# Patient Record
Sex: Male | Born: 1950 | Race: White | Hispanic: No | Marital: Married | Smoking: Never smoker
Health system: Southern US, Community
[De-identification: ages and names within clinical notes are randomized; demographics above are authoritative.]

## PROBLEM LIST (undated history)

## (undated) DIAGNOSIS — G43909 Migraine, unspecified, not intractable, without status migrainosus: Secondary | ICD-10-CM

## (undated) DIAGNOSIS — G4730 Sleep apnea, unspecified: Secondary | ICD-10-CM

## (undated) DIAGNOSIS — H269 Unspecified cataract: Secondary | ICD-10-CM

## (undated) DIAGNOSIS — T7840XA Allergy, unspecified, initial encounter: Secondary | ICD-10-CM

## (undated) DIAGNOSIS — J309 Allergic rhinitis, unspecified: Secondary | ICD-10-CM

## (undated) DIAGNOSIS — E785 Hyperlipidemia, unspecified: Secondary | ICD-10-CM

## (undated) DIAGNOSIS — K219 Gastro-esophageal reflux disease without esophagitis: Secondary | ICD-10-CM

## (undated) DIAGNOSIS — I456 Pre-excitation syndrome: Secondary | ICD-10-CM

## (undated) DIAGNOSIS — M47812 Spondylosis without myelopathy or radiculopathy, cervical region: Secondary | ICD-10-CM

## (undated) HISTORY — DX: Gastro-esophageal reflux disease without esophagitis: K21.9

## (undated) HISTORY — DX: Allergy, unspecified, initial encounter: T78.40XA

## (undated) HISTORY — PX: OTHER SURGICAL HISTORY: SHX169

## (undated) HISTORY — DX: Unspecified cataract: H26.9

## (undated) HISTORY — DX: Hyperlipidemia, unspecified: E78.5

## (undated) HISTORY — DX: Sleep apnea, unspecified: G47.30

## (undated) HISTORY — DX: Spondylosis without myelopathy or radiculopathy, cervical region: M47.812

## (undated) HISTORY — DX: Allergic rhinitis, unspecified: J30.9

## (undated) HISTORY — PX: COLONOSCOPY: SHX174

## (undated) HISTORY — DX: Pre-excitation syndrome: I45.6

## (undated) HISTORY — DX: Migraine, unspecified, not intractable, without status migrainosus: G43.909

---

## 1999-03-02 ENCOUNTER — Encounter (INDEPENDENT_AMBULATORY_CARE_PROVIDER_SITE_OTHER): Payer: Self-pay | Admitting: *Deleted

## 1999-03-02 ENCOUNTER — Ambulatory Visit (HOSPITAL_COMMUNITY): Admission: RE | Admit: 1999-03-02 | Discharge: 1999-03-02 | Payer: Self-pay | Admitting: *Deleted

## 2005-01-14 ENCOUNTER — Ambulatory Visit: Payer: Self-pay | Admitting: Internal Medicine

## 2005-01-14 LAB — CONVERTED CEMR LAB: PSA: 0.79 ng/mL

## 2005-01-18 ENCOUNTER — Ambulatory Visit: Payer: Self-pay | Admitting: Internal Medicine

## 2005-02-11 ENCOUNTER — Ambulatory Visit: Payer: Self-pay | Admitting: *Deleted

## 2005-06-27 ENCOUNTER — Ambulatory Visit: Payer: Self-pay | Admitting: Gastroenterology

## 2005-07-12 ENCOUNTER — Encounter (INDEPENDENT_AMBULATORY_CARE_PROVIDER_SITE_OTHER): Payer: Self-pay | Admitting: *Deleted

## 2005-07-12 ENCOUNTER — Ambulatory Visit: Payer: Self-pay | Admitting: Gastroenterology

## 2006-12-05 ENCOUNTER — Ambulatory Visit: Payer: Self-pay | Admitting: Cardiology

## 2007-01-06 ENCOUNTER — Ambulatory Visit: Payer: Self-pay | Admitting: Internal Medicine

## 2007-01-06 LAB — CONVERTED CEMR LAB
ALT: 22 units/L (ref 0–53)
AST: 22 units/L (ref 0–37)
Albumin: 4.1 g/dL (ref 3.5–5.2)
Alkaline Phosphatase: 65 units/L (ref 39–117)
BUN: 21 mg/dL (ref 6–23)
Basophils Absolute: 0 10*3/uL (ref 0.0–0.1)
Calcium: 9.4 mg/dL (ref 8.4–10.5)
Chloride: 106 meq/L (ref 96–112)
Cholesterol: 168 mg/dL (ref 0–200)
Creatinine, Ser: 1.1 mg/dL (ref 0.4–1.5)
Eosinophils Absolute: 0.1 10*3/uL (ref 0.0–0.6)
GFR calc non Af Amer: 74 mL/min
HCT: 42.7 % (ref 39.0–52.0)
Hemoglobin, Urine: NEGATIVE
Ketones, ur: NEGATIVE mg/dL
MCHC: 35.4 g/dL (ref 30.0–36.0)
MCV: 91.6 fL (ref 78.0–100.0)
Nitrite: NEGATIVE
PSA: 0.85 ng/mL
PSA: 0.85 ng/mL (ref 0.10–4.00)
Platelets: 183 10*3/uL (ref 150–400)
RBC: 4.67 M/uL (ref 4.22–5.81)
RDW: 11.3 % — ABNORMAL LOW (ref 11.5–14.6)
Sodium: 141 meq/L (ref 135–145)
Total Bilirubin: 1.1 mg/dL (ref 0.3–1.2)
Total CHOL/HDL Ratio: 6.2
Triglycerides: 341 mg/dL (ref 0–149)
Urine Glucose: NEGATIVE mg/dL
Urobilinogen, UA: 0.2 (ref 0.0–1.0)
VLDL: 68 mg/dL — ABNORMAL HIGH (ref 0–40)
pH: 6 (ref 5.0–8.0)

## 2007-01-15 ENCOUNTER — Ambulatory Visit: Payer: Self-pay | Admitting: Internal Medicine

## 2007-01-19 ENCOUNTER — Encounter: Payer: Self-pay | Admitting: Internal Medicine

## 2007-01-19 DIAGNOSIS — E785 Hyperlipidemia, unspecified: Secondary | ICD-10-CM | POA: Insufficient documentation

## 2007-01-19 DIAGNOSIS — I456 Pre-excitation syndrome: Secondary | ICD-10-CM | POA: Insufficient documentation

## 2007-01-19 DIAGNOSIS — K219 Gastro-esophageal reflux disease without esophagitis: Secondary | ICD-10-CM | POA: Insufficient documentation

## 2007-01-19 DIAGNOSIS — G43909 Migraine, unspecified, not intractable, without status migrainosus: Secondary | ICD-10-CM | POA: Insufficient documentation

## 2007-01-19 DIAGNOSIS — J309 Allergic rhinitis, unspecified: Secondary | ICD-10-CM

## 2007-01-19 HISTORY — DX: Migraine, unspecified, not intractable, without status migrainosus: G43.909

## 2007-01-19 HISTORY — DX: Hyperlipidemia, unspecified: E78.5

## 2007-01-19 HISTORY — DX: Allergic rhinitis, unspecified: J30.9

## 2007-01-19 HISTORY — DX: Pre-excitation syndrome: I45.6

## 2007-01-19 HISTORY — DX: Gastro-esophageal reflux disease without esophagitis: K21.9

## 2007-11-16 ENCOUNTER — Ambulatory Visit: Payer: Self-pay | Admitting: Internal Medicine

## 2007-11-16 DIAGNOSIS — IMO0002 Reserved for concepts with insufficient information to code with codable children: Secondary | ICD-10-CM | POA: Insufficient documentation

## 2007-11-20 ENCOUNTER — Encounter: Admission: RE | Admit: 2007-11-20 | Discharge: 2007-11-20 | Payer: Self-pay | Admitting: Internal Medicine

## 2007-12-07 ENCOUNTER — Encounter: Payer: Self-pay | Admitting: Internal Medicine

## 2007-12-24 ENCOUNTER — Ambulatory Visit: Payer: Self-pay | Admitting: Cardiology

## 2008-01-13 ENCOUNTER — Ambulatory Visit: Payer: Self-pay | Admitting: Internal Medicine

## 2008-01-15 ENCOUNTER — Ambulatory Visit: Payer: Self-pay | Admitting: Internal Medicine

## 2008-01-15 LAB — CONVERTED CEMR LAB
Albumin: 4.1 g/dL (ref 3.5–5.2)
BUN: 24 mg/dL — ABNORMAL HIGH (ref 6–23)
Bilirubin Urine: NEGATIVE
Calcium: 9.1 mg/dL (ref 8.4–10.5)
Cholesterol: 97 mg/dL (ref 0–200)
Eosinophils Relative: 2.2 % (ref 0.0–5.0)
GFR calc Af Amer: 81 mL/min
Glucose, Bld: 99 mg/dL (ref 70–99)
HCT: 45.4 % (ref 39.0–52.0)
HDL: 26.7 mg/dL — ABNORMAL LOW (ref >39.0)
Hemoglobin: 15.8 g/dL (ref 13.0–17.0)
MCV: 93.8 fL (ref 78.0–100.0)
Monocytes Absolute: 0.4 10*3/uL (ref 0.1–1.0)
Monocytes Relative: 9.9 % (ref 3.0–12.0)
Neutro Abs: 1.9 10*3/uL (ref 1.4–7.7)
Nitrite: NEGATIVE
PSA: 0.87 ng/mL (ref 0.10–4.00)
RDW: 11.1 % — ABNORMAL LOW (ref 11.5–14.6)
TSH: 2.11 microintl units/mL (ref 0.35–5.50)
Total Protein, Urine: NEGATIVE mg/dL
Total Protein: 7.3 g/dL (ref 6.0–8.3)
Urine Glucose: NEGATIVE mg/dL
WBC: 3.8 10*3/uL — ABNORMAL LOW (ref 4.5–10.5)
pH: 5.5 (ref 5.0–8.0)

## 2008-01-20 ENCOUNTER — Ambulatory Visit: Payer: Self-pay | Admitting: Internal Medicine

## 2008-02-08 ENCOUNTER — Telehealth (INDEPENDENT_AMBULATORY_CARE_PROVIDER_SITE_OTHER): Payer: Self-pay | Admitting: *Deleted

## 2008-02-16 ENCOUNTER — Telehealth: Payer: Self-pay | Admitting: Internal Medicine

## 2008-10-07 ENCOUNTER — Encounter (INDEPENDENT_AMBULATORY_CARE_PROVIDER_SITE_OTHER): Payer: Self-pay | Admitting: *Deleted

## 2009-01-31 ENCOUNTER — Ambulatory Visit: Payer: Self-pay | Admitting: Cardiology

## 2009-02-21 ENCOUNTER — Telehealth: Payer: Self-pay | Admitting: Cardiology

## 2009-02-22 ENCOUNTER — Ambulatory Visit: Payer: Self-pay | Admitting: Internal Medicine

## 2009-02-23 ENCOUNTER — Encounter (INDEPENDENT_AMBULATORY_CARE_PROVIDER_SITE_OTHER): Payer: Self-pay | Admitting: *Deleted

## 2009-02-24 ENCOUNTER — Telehealth: Payer: Self-pay | Admitting: Internal Medicine

## 2009-03-21 ENCOUNTER — Telehealth (INDEPENDENT_AMBULATORY_CARE_PROVIDER_SITE_OTHER): Payer: Self-pay | Admitting: *Deleted

## 2009-05-19 ENCOUNTER — Telehealth: Payer: Self-pay | Admitting: Internal Medicine

## 2009-05-23 ENCOUNTER — Ambulatory Visit: Payer: Self-pay | Admitting: Internal Medicine

## 2009-06-05 ENCOUNTER — Telehealth (INDEPENDENT_AMBULATORY_CARE_PROVIDER_SITE_OTHER): Payer: Self-pay | Admitting: *Deleted

## 2009-06-05 ENCOUNTER — Encounter: Payer: Self-pay | Admitting: Internal Medicine

## 2009-06-06 ENCOUNTER — Ambulatory Visit: Payer: Self-pay | Admitting: Internal Medicine

## 2009-06-06 ENCOUNTER — Telehealth: Payer: Self-pay | Admitting: Internal Medicine

## 2009-06-06 LAB — CONVERTED CEMR LAB
AST: 27 units/L (ref 0–37)
Albumin: 4.8 g/dL (ref 3.5–5.2)
Alkaline Phosphatase: 60 units/L (ref 39–117)
Basophils Absolute: 0 10*3/uL (ref 0.0–0.1)
Bilirubin Urine: NEGATIVE
Calcium: 9.7 mg/dL (ref 8.4–10.5)
GFR calc non Af Amer: 81.66 mL/min (ref >60)
Hemoglobin, Urine: NEGATIVE
Hemoglobin: 15.8 g/dL (ref 13.0–17.0)
Ketones, ur: NEGATIVE mg/dL
LDL Cholesterol: 61 mg/dL (ref 0–99)
Leukocytes, UA: NEGATIVE
Lymphocytes Relative: 34.1 % (ref 12.0–46.0)
Monocytes Relative: 7.2 % (ref 3.0–12.0)
Neutro Abs: 3.3 10*3/uL (ref 1.4–7.7)
PSA: 1.13 ng/mL (ref 0.10–4.00)
RBC: 4.95 M/uL (ref 4.22–5.81)
RDW: 11.3 % — ABNORMAL LOW (ref 11.5–14.6)
Sodium: 142 meq/L (ref 135–145)
Specific Gravity, Urine: 1.025 (ref 1.000–1.030)
TSH: 1.19 microintl units/mL (ref 0.35–5.50)
Urobilinogen, UA: 0.2 (ref 0.0–1.0)
VLDL: 20.8 mg/dL (ref 0.0–40.0)

## 2009-06-09 ENCOUNTER — Telehealth (INDEPENDENT_AMBULATORY_CARE_PROVIDER_SITE_OTHER): Payer: Self-pay | Admitting: *Deleted

## 2009-06-15 ENCOUNTER — Telehealth: Payer: Self-pay | Admitting: Internal Medicine

## 2009-07-13 ENCOUNTER — Ambulatory Visit: Payer: Self-pay | Admitting: Internal Medicine

## 2009-07-13 LAB — CONVERTED CEMR LAB
BUN: 20 mg/dL (ref 6–23)
Basophils Absolute: 0 10*3/uL (ref 0.0–0.1)
Calcium: 9.1 mg/dL (ref 8.4–10.5)
Creatinine, Ser: 1.2 mg/dL (ref 0.4–1.5)
Eosinophils Relative: 1.5 % (ref 0.0–5.0)
GFR calc non Af Amer: 66.14 mL/min (ref >60)
Monocytes Relative: 8.4 % (ref 3.0–12.0)
Neutrophils Relative %: 57.4 % (ref 43.0–77.0)
Platelets: 155 10*3/uL (ref 150.0–400.0)
Prothrombin Time: 9.9 s (ref 9.1–11.7)
WBC: 4.4 10*3/uL — ABNORMAL LOW (ref 4.5–10.5)
aPTT: 25.3 s (ref 21.7–28.8)

## 2009-07-20 ENCOUNTER — Observation Stay (HOSPITAL_COMMUNITY): Admission: RE | Admit: 2009-07-20 | Discharge: 2009-07-21 | Payer: Self-pay | Admitting: Internal Medicine

## 2009-07-20 ENCOUNTER — Ambulatory Visit: Payer: Self-pay | Admitting: Internal Medicine

## 2009-08-25 ENCOUNTER — Ambulatory Visit: Payer: Self-pay | Admitting: Internal Medicine

## 2009-08-29 ENCOUNTER — Telehealth: Payer: Self-pay | Admitting: Internal Medicine

## 2010-02-07 ENCOUNTER — Ambulatory Visit: Payer: Self-pay | Admitting: Internal Medicine

## 2010-02-07 ENCOUNTER — Encounter: Payer: Self-pay | Admitting: Internal Medicine

## 2010-02-07 DIAGNOSIS — M542 Cervicalgia: Secondary | ICD-10-CM | POA: Insufficient documentation

## 2010-02-07 DIAGNOSIS — R079 Chest pain, unspecified: Secondary | ICD-10-CM | POA: Insufficient documentation

## 2010-05-21 ENCOUNTER — Encounter: Payer: Self-pay | Admitting: Internal Medicine

## 2010-05-29 NOTE — Assessment & Plan Note (Signed)
Summary: neck pain/sinus trouble/lb   Vital Signs:  Patient profile:   60 year old male Height:      72 inches Weight:      168.50 pounds O2 Sat:      97 % on Room air Temp:     97.1 degrees F oral Pulse rate:   68 / minute BP sitting:   132 / 80  (left arm) Cuff size:   regular  Vitals Entered By: Jarome Lamas (February 07, 2010 8:25 AM)  O2 Flow:  Room air CC: neck pain/pb   CC:  neck pain/pb.  History of Present Illness: here to f/u with acute  - 2 issues:  #1 - c/o mod sinus and nasal congestion worse overall for the past yr, been getting by with sudafed and ibuprofen but not wanting to do this further, and does not seem to work as well as before with the congestion, ad has osme post hasal gtt adn voice change, not hoarseness with non prod cough, mild.  NO fever, pain, blood, hoaresness, and Pt denies worsening sob, doe, wheezing, orthopnea, pnd, worsening LE edema, palps, dizziness or syncope .  Has been tx by rx med for allergies but did not seem to help so he quit - not sure of names \\par  #2 Incidently with right side mid chest pain, sharp, no radiation, intemittemittent for 2 -3 days, worse after eating, no pleuritic, non exertional , has not tried antacid but thinks might be reflux related but has been worse in the past. No abd pain,  n/v, diaphoresis,  #3 - also with mild to mod neck pain for one year, only occurs with horizontal movement , worse to turn left but also to the right, also iwth  popping and crackling with flexion/extension;  no radicular pain or weak or numb. No falls, injury, fever, wt loss, LE symtpoms like pain/weakn/numb, no bowel change or bladder.    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 1po Once Daily 2)  Adult Aspirin Ec Low Strength 81 Mg Tbec (Aspirin) .Marland Kitchen.. 1 By Mouth Once Daily  Current Medications (verified): 1)  Simvastatin 40 Mg Tabs (Simvastatin) .Marland Kitchen.. 1po Once Daily 2)  Adult Aspirin Ec Low Strength 81 Mg Tbec (Aspirin) .Marland Kitchen.. 1 By Mouth Once Daily 3)  Levocetirizine Dihydrochloride 5 Mg Tabs (Levocetirizine Dihydrochloride) .Marland Kitchen.. 1po Once Daily As Needed Allergies 4)  Fluticasone Propionate 50 Mcg/act Susp (Fluticasone Propionate) .... 2 Spray/side Once Daily 5)  Omeprazole 20 Mg Cpdr (Omeprazole) .Marland Kitchen.. 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   Head:  normocephalic and atraumatic.   Eyes:  vision grossly intact, pupils equal, and pupils round.   Ears:  bialt tm;s mild red, sinus nontender Nose:  nasal dischargemucosal pallor and mucosal edema.   Mouth:  pharyngeal erythema and fair dentition.   Neck:  supple and no masses.   Lungs:  normal respiratory effort and normal breath sounds.   Heart:  normal rate and regular rhythm.   Abdomen:  soft, non-tender, and normal bowel sounds.   Msk:  no joint  tenderness and no joint swelling.  ; no chest wall tender Extremities:  no edema, no erythema  Neurologic:  strength normal in all extremities, sensation intact to light touch, gait normal, and DTRs symmetrical and normal.   Skin:  no rashes.   Psych:  not depressed appearing and slightly anxious.     Impression & Recommendations:  Problem # 1:  ALLERGIC RHINITIS (ICD-477.9)  perennial by hx, more moderate symtpoms now - for treat as above, f/u any worsening signs or symptoms   His updated medication list for this problem includes:    Levocetirizine Dihydrochloride 5 Mg Tabs (Levocetirizine dihydrochloride) .Marland Kitchen... 1po once daily as needed allergies    Fluticasone Propionate 50 Mcg/act Susp (Fluticasone propionate) .Marland Kitchen... 2 spray/side once daily  Problem # 2:  NECK PAIN (ICD-723.1)  His updated medication list for this problem includes:    Adult Aspirin Ec Low Strength 81 Mg Tbec (Aspirin) .Marland Kitchen... 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 1po once daily 2)  Adult Aspirin Ec Low Strength 81 Mg Tbec (Aspirin) .Marland Kitchen.. 1 by mouth once daily 3)  Levocetirizine Dihydrochloride 5 Mg Tabs (Levocetirizine dihydrochloride) .Marland Kitchen.. 1po once daily as needed allergies 4)  Fluticasone Propionate 50 Mcg/act Susp (Fluticasone propionate) .... 2 spray/side once daily 5)  Omeprazole 20 Mg Cpdr (Omeprazole) .Marland Kitchen.. 1 by mouth once daily  Other Orders: Admin 1st Vaccine (44034) Flu Vaccine 79yrs + [phone])  Patient Instructions: 1)  you had the flu shot today 2)  Please take all new medications as prescribed - the generic xyzal  (levocetirizine) adn the nasal spray for the allergies, as well as the generic prilosec 20 mg per day for the probable reflux 3)  You can also use Tylenol arthritis OTC or it's generic for pain  4)  Your EKG was ok today 5)  Please go to Radiology in the basement level for your X-Ray today  6)  Please call the number on the [facility] Card for results of your testing  7)  Please schedule a follow-up appointment in 6 months with CPX labs Prescriptions: OMEPRAZOLE 20 MG CPDR (OMEPRAZOLE) 1 by mouth once daily  #90 x 3   Entered and Authorized by:   Corwin Levins MD   Signed by:   Corwin Levins MD on 02/07/2010   Method used:   Print then Give to Patient   RxID:   [id number] FLUTICASONE PROPIONATE 50 MCG/ACT SUSP (FLUTICASONE PROPIONATE) 2 spray/side once daily  #1 x 11   Entered and Authorized by:   Corwin Levins MD  Signed by:   Corwin Levins MD on 02/07/2010   Method used:   Print then Give to Patient   RxID:   [id number] LEVOCETIRIZINE DIHYDROCHLORIDE 5 MG TABS (LEVOCETIRIZINE DIHYDROCHLORIDE) 1po once daily as needed allergies  #30 x 11   Entered and Authorized by:   Corwin Levins MD   Signed by:   Corwin Levins MD on 02/07/2010   Method used:   Print then Give to Patient   RxID:   [id number]   Flu Vaccine Consent Questions     Do you have a history of severe allergic reactions to this vaccine? no    Any prior history of allergic reactions to egg and/or gelatin? no    Do you have a sensitivity to the preservative Thimersol? no    Do you have a past history of Guillan-Barre Syndrome? no    Do you currently have an acute febrile illness? no    Have you ever had a severe reaction to latex? no    Vaccine information given and explained to patient? yes    Are you currently pregnant? no    Lot Number:[id number]   Exp Date:10/27/2010   Site Given  Left Deltoid IMflu

## 2010-05-29 NOTE — Assessment & Plan Note (Signed)
Summary: f/u appt per pt/#/cd   Vital Signs:  Patient profile:   60 year old male Height:      72 inches Weight:      169 pounds BMI:     23.00 O2 Sat:      97 % on Room air Temp:     98.3 degrees F oral Pulse rate:   47 / minute BP sitting:   102 / 62  (left arm) Cuff size:   regular  Vitals Entered ByZella Ball Ewing (June 06, 2009 10:34 AM)  O2 Flow:  Room air  CC: followup/RE   CC:  followup/RE.  History of Present Illness: overall oding well;  llikely to have SVT ablation next month;  Pt denies CP, sob, doe, wheezing, orthopnea, pnd, worsening LE edema, palps, dizziness or syncope Pt denies new neuro symptoms such as headache, facial or extremity weakness .  Recent med change per dr Graciela Husbands seemed to help with the fatigue.  Has been trying to follow lower chol diet   Preventive Screening-Counseling & Management      Drug Use:  no.    Problems Prior to Update: 1)  Wolff (WOLFE)-parkinson-white (WPW) Syndrome  (ICD-426.7) 2)  Hyperlipidemia  (ICD-272.4) 3)  Gerd  (ICD-530.81) 4)  Preventive Health Care  (ICD-V70.0) 5)  Lumbar Radiculopathy, Right  (ICD-724.4) 6)  Migraine Headache  (ICD-346.90) 7)  Allergic Rhinitis  (ICD-477.9)  Medications Prior to Update: 1)  Crestor 20 Mg Tabs (Rosuvastatin Calcium) .... 1/2 By Mouth Once Daily 2)  Adult Aspirin Ec Low Strength 81 Mg Tbec (Aspirin) .Marland Kitchen.. 1 By Mouth Once Daily 3)  Sudafed 24 Hour 240 Mg Xr24h-Tab (Pseudoephedrine Hcl) .... As Needed 4)  Atenolol 50 Mg Tabs (Atenolol) .... Take One Tablet Once Daily  Current Medications (verified): 1)  Crestor 20 Mg Tabs (Rosuvastatin Calcium) .... 1/2 By Mouth Once Daily 2)  Adult Aspirin Ec Low Strength 81 Mg Tbec (Aspirin) .Marland Kitchen.. 1 By Mouth Once Daily 3)  Atenolol 50 Mg Tabs (Atenolol) .... 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 father with colon cancer prostate cancer - father HTN all family deceased except for 2 aunts  Social History: Reviewed history from 01/20/2008 and no changes required. Never Smoked Alcohol use-yes Married 1 son work - Designer, industrial/product Drug use-no Drug Use:  no  Review of Systems  The patient denies anorexia, fever, weight loss, weight gain, vision loss, decreased hearing, hoarseness, chest pain, syncope, dyspnea on exertion, peripheral edema, prolonged cough, headaches, hemoptysis, abdominal pain, melena, hematochezia, severe indigestion/heartburn, hematuria, incontinence, muscle weakness, suspicious skin lesions, transient blindness, difficulty walking, depression, unusual weight change, abnormal bleeding, enlarged lymph nodes, and angioedema.         all otherwise negative per pt -  Physical Exam  General:  alert and well-developed.   Head:  normocephalic and atraumatic.   Eyes:  vision grossly intact, pupils equal, and pupils round.   Ears:  R ear normal and L ear normal.   Nose:  no external deformity and no nasal discharge.   Mouth:  no gingival abnormalities and pharynx pink and moist.   Neck:  supple and no masses.   Lungs:  normal  respiratory effort and normal breath sounds.   Heart:  normal rate and regular rhythm.   Abdomen:  soft, non-tender, and normal bowel sounds.   Msk:  no joint tenderness and no joint swelling.   Extremities:  no edema, no erythema  Neurologic:  cranial nerves II-XII intact and strength normal in all extremities.     Impression & Recommendations:  Problem # 1:  Preventive Health Care (ICD-V70.0)  Overall doing well, age appropriate education and counseling updated and referral for appropriate preventive services done unless declined, immunizations up to date or declined, diet counseling done if overweight, urged to quit smoking if smokes , most recent labs reviewed and current ordered if appropriate, ecg reviewed or declined (interpretation per ECG scanned in the EMR if done); information regarding Medicare Prevention requirements given if appropriate   Orders: TLB-BMP (Basic Metabolic Panel-BMET) (80048-METABOL) TLB-CBC Platelet - w/Differential (85025-CBCD) TLB-Hepatic/Liver Function Pnl (80076-HEPATIC) TLB-Lipid Panel (80061-LIPID) TLB-TSH (Thyroid Stimulating Hormone) (84443-TSH) TLB-PSA (Prostate Specific Antigen) (84153-PSA) TLB-Udip ONLY (81003-UDIP)  Problem # 2:  HYPERLIPIDEMIA (ICD-272.4)  His updated medication list for this problem includes:    Crestor 20 Mg Tabs (Rosuvastatin calcium) .Marland Kitchen... 1/2 by mouth once daily treat as above, f/u any worsening signs or symptoms   Labs Reviewed: SGOT: 30 (01/15/2008)   SGPT: 30 (01/15/2008)   HDL:26.7 (01/15/2008), 27.1 (01/06/2007)  LDL:40 (01/15/2008), DEL (24/40/1027)  Chol:97 (01/15/2008), 168 (01/06/2007)  Trig:150 (01/15/2008), 341 (01/06/2007)  Complete Medication List: 1)  Crestor 20 Mg Tabs (Rosuvastatin calcium) .... 1/2 by mouth once daily 2)  Adult Aspirin Ec Low Strength 81 Mg Tbec (Aspirin) .Marland Kitchen.. 1 by mouth once daily 3)  Atenolol 50 Mg Tabs (Atenolol) .... Take one tablet once daily  Other Orders: Tdap => 28yrs IM  (25366) Admin 1st Vaccine (44034) Admin 1st Vaccine (74259) Flu Vaccine 25yrs + [phone])  Patient Instructions: 1)  you had the flu shot and tetanus shots today 2)  Please go to the Lab in the basement for your blood and/or urine tests today 3)  Please schedule a follow-up appointment in 1 year or sooner if needed 4)  remember, you only take half of the crestor to help save on cost Prescriptions: CRESTOR 20 MG TABS (ROSUVASTATIN CALCIUM) 1 by mouth once daily  #90 x 3   Entered and Authorized by:   Corwin Levins MD   Signed by:   Corwin Levins MD on 06/06/2009   Method used:   Print then Give to Patient   RxID:   [id number]    Immunizations Administered:  Tetanus Vaccine:    Vaccine Type: Tdap    Site: right deltoid    Mfr: GlaxoSmithKline    Dose: 0.5 ml    Route: IM    Given by: Robin Ewing    Exp. Date: 06/24/2011    Lot #: [id number]    VIS given: 03/17/07 version given June 06, 2009.    Flu Vaccine Consent Questions     Do you have a history of severe allergic reactions to this vaccine? no    Any prior history of allergic reactions to egg and/or gelatin?  no    Do you have a sensitivity to the preservative Thimersol? no    Do you have a past history of Guillan-Barre Syndrome? no    Do you currently have an acute febrile illness? no    Have you ever had a severe reaction to latex? no    Vaccine information given and explained to patient? yes    Are you currently pregnant? no    Lot Number:[id number]   Exp Date:10/26/2009   Site Given  Left Deltoid IMflu

## 2010-05-29 NOTE — Progress Notes (Signed)
Summary: returning your call   Phone Note Call from Patient Call back at [phone]   Caller: Patient Summary of Call: returning your call. [phone] Initial call taken by: Edman Circle,  June 06, 2009 1:06 PM  Follow-up for Phone Call        PT AWARE Follow-up by: Duncan Dull, RN, BSN,  June 06, 2009 3:44 PM

## 2010-05-29 NOTE — Letter (Signed)
Summary: ELectrophysiology/Ablation Procedure Instructions  [facility], Main Office  [address]   [place], [place] [address]   Phone: [phone]  Fax: [phone]     Ablation Procedure Instructions    You are scheduled for a(n) SVT ABLATION on 07/20/2009 at 2:30 PM with Dr. Graciela Husbands.  1.  Please come to the Short Stay Center at [facility] at 12:30PM on the day of your procedure.  2.  Come prepared to stay overnight.   Please bring your insurance cards and a list of your medications.  3.  Come to the [place] office on 07/13/2009 for lab work.  The lab at [facility] is open from 8:30 AM to 1:30 PM and 2:30 PM to 5:00 PM.  The lab at [facility] is open from 7:30 AM to 5:30 PM.  You do not have to be fasting.  4.  Do not have anything to eat or drink after midnight the night before your procedure.  5.  You may take your medications with a small amount of water that morning.  6.  Educational material received:  _____ EP  X Ablation   * Occasionally, EP studies and ablations can become lengthy.  Please make your family aware of this before your procedure starts.  Average time ranges from 2-8 hours for EP studies/ablations.  Your physician will locate your family after the procedure with the results.  * If you have any questions after you get home, please call the office at [phone].

## 2010-05-29 NOTE — Progress Notes (Signed)
Summary: status of paperwork/rtn call on 06/20/2009/lg   Phone Note Call from Patient Call back at Home Phone [phone] Call back at [phone] ext [phone]   Caller: Spouse Reason for Call: Talk to Nurse Details for Reason: Per pt wife calling to check on status of paperwork that was drop off 2/10. surgery is 3/24.Marland Kitchen pt has to have paper work in by 2/22..  Initial call taken by: Lorne Skeens,  June 15, 2009 9:00 AM  Follow-up for Phone Call        Called and left message on machine for Patty to [facility]. I do not recall receiving this ppw. I have called Rachael with [employer] to see if she has seen it. Need to know who they left the ppw with.  Duncan Dull, RN, BSN  June 15, 2009 12:34 PM   Additional Follow-up for Phone Call Additional follow up Details #1::        Left message on machine of Linsey Hirota that papers are ready to be p/u Duncan Dull, RN, BSN  June 19, 2009 3:27 PM   pt rtn call to pick up paperwork. Please contact him at [phone] regarding where and when can he pick up paperwork. I (Lela) called up to the front desk and paperwork was not there.     Additional Follow-up for Phone Call Additional follow up Details #2::    S/W pt and he will p/u ppw today around 5. Left at front desk, they know to expect him if not before 5 then soon thereafter. Follow-up by: Duncan Dull, RN, BSN,  June 20, 2009 2:39 PM

## 2010-05-29 NOTE — Progress Notes (Signed)
Summary:  Ablation Instructions   Phone Note Outgoing Call   Call placed by: Duncan Dull, RN, BSN,  June 05, 2009 11:52 AM Call placed to: Patient Summary of Call: Called patient and left message on machine for him to call me back To discuss procedure, date, time and instructions Duncan Dull, RN, BSN  June 05, 2009 11:53 AM      Appended Document:  Ablation Instructions PT AWARE

## 2010-05-29 NOTE — Progress Notes (Signed)
Summary: Crestor?  Phone Note Call from Patient Call back at Work Phone [phone]   Caller: Patient Summary of Call: pt called requesting to either be D/Cd from Crestor or have medication changed to generic. per pt last labs showed pt's chol to be improved. please advise Initial call taken by: Margaret Pyle, CMA,  Aug 29, 2009 10:27 AM  Follow-up for Phone Call        not all chol meds work as well and the chol will go up if he stops the medication, but I can try to change to simvastatin 40 mg Follow-up by: Corwin Levins MD,  Aug 29, 2009 11:04 AM  Additional Follow-up for Phone Call Additional follow up Details #1::        pt informed via VM Additional Follow-up by: Margaret Pyle, CMA,  Aug 29, 2009 11:12 AM    New/Updated Medications: SIMVASTATIN 40 MG TABS (SIMVASTATIN) 1po once daily Prescriptions: SIMVASTATIN 40 MG TABS (SIMVASTATIN) 1po once daily  #90 x 3   Entered and Authorized by:   Corwin Levins MD   Signed by:   Corwin Levins MD on 08/29/2009   Method used:   Electronically to        [facility]  [address] [phone]* (retail)       [address]       [place], [place]  [address]       Ph: [phone]       Fax: [phone]   RxID:   [id number]  done hardcopy to LIM side B - dahlia  Corwin Levins MD  Aug 29, 2009 11:04 AM

## 2010-05-29 NOTE — Letter (Signed)
Summary: Chief Financial Officer Insurance Certification Notification   Imported By: Roderic Ovens 08/23/2009 11:01:46  _____________________________________________________________________  External Attachment:    Type:   Image     Comment:   External Document

## 2010-05-29 NOTE — Progress Notes (Signed)
   Walk in Patient Form Recieved "Pt.left FMLA papers" forwarded to [employer] for processing  [facility]  June 09, 2009 9:24 AM

## 2010-05-29 NOTE — Assessment & Plan Note (Signed)
Summary: eph/jml      Allergies Added: NKDA  CC:  eph/.  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 Post procedure he has had no sustained palpitations. The bruise in his right leg has resolved.    Current Medications (verified): 1)  Crestor 20 Mg Tabs (Rosuvastatin Calcium) .... 1/2 By Mouth Once Daily 2)  Adult Aspirin Ec Low Strength 81 Mg Tbec (Aspirin) .Marland Kitchen.. 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) hyperplastic only colon polyp 3/07  Past Surgical History: Last updated: 01/19/2007 Appendectomy R Knee L Shoulder  Family History: Last updated: 06/06/2009 father with colon cancer prostate cancer - father HTN all family deceased except for 2 aunts  Social History: Last updated: 06/06/2009 Never Smoked Alcohol use-yes Married 1 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 HEENT Normal.  Neck veins were flat, carotids were brisk.  Lungs were clear.  Heart sounds were regular without murmurs or gallops.  Abdomen was soft with active bowel sounds. There is no clubbing cyanosis or edema. 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 We'll discontinue his aspirin. We will see him as needed. The following medications were removed from the medication list:    Atenolol 50 Mg Tabs (Atenolol) .Marland Kitchen... Take one tablet once daily His updated medication list for this problem includes:    Adult Aspirin Ec Low Strength 81 Mg Tbec (Aspirin) .Marland Kitchen... 1 by mouth once daily  Orders: EKG w/ Interpretation (93000)

## 2010-05-29 NOTE — Progress Notes (Signed)
Summary: discuss surgery    Phone Note Call from Patient Call back at Home Phone [phone] Call back at Work Phone [phone]   Caller: Patient Reason for Call: Talk to Nurse Details for Reason: pt has a upcoming appt on tuesday, want to discuss surgery .  Initial call taken by: Lorne Skeens,  May 19, 2009 10:22 AM  Follow-up for Phone Call        Wanted to know when in March he could have his ablation. Dates discussed. He will think about it and s/w Dr. Graciela Husbands on his upcoming appt.  Follow-up by: Duncan Dull, RN, BSN,  May 19, 2009 11:07 AM

## 2010-05-29 NOTE — Assessment & Plan Note (Signed)
Summary: 3 month rov/sl      Allergies Added: NKDA  CC:  3 month rov.  History of Present Illness: Danny Young has a 30 year history of abrupt onset-offset tachypalpitations typically lasting 3-5 minutes.  They are associated  with LH and diaphoresis but no chest pain and sob.   He has taken betablockers for this for some time. They have been associated with fatigue however, they are quite effective in reducing the frequency from almost daily to a couple times a year.  Evaluation in the past has included an ultrasound which I reviewed today which demonstrated normal valvular structures. ECG has demonstrated ventricular preexcitation.  he has now decided that he would like to pursue catheter ablation sometime in March.     Current Medications (verified): 1)  Crestor 20 Mg Tabs (Rosuvastatin Calcium) .... 1/2 By Mouth Once Daily 2)  Adult Aspirin Ec Low Strength 81 Mg Tbec (Aspirin) .Marland Kitchen.. 1 By Mouth Once Daily 3)  Sudafed 24 Hour 240 Mg Xr24h-Tab (Pseudoephedrine Hcl) .... As Needed 4)  Atenolol 50 Mg Tabs (Atenolol) .... 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) MIGRAINE HEADACHE (ICD-346.90) ALLERGIC RHINITIS (ICD-477.9) WOLFF (WOLFE)-PARKINSON-WHITE (WPW) SYNDROME (ICD-426.7) hyperplastic only colon polyp 3/07  Past Surgical History: Last updated: 01/19/2007 Appendectomy R Knee L Shoulder  Family History: Last updated: 01/20/2008 father with colon cancer prostate cancer - father HTN  Social History: Last updated: 01/20/2008 Never Smoked Alcohol use-yes Married 1 son work - Designer, industrial/product  Vital Signs:  Patient profile:   60 year old male Height:      72 inches Weight:      170 pounds BMI:     23.14 Pulse rate:    58 / minute Pulse rhythm:   regular BP sitting:   108 / 70  (left arm) Cuff size:   regular  Vitals Entered By: Judithe Modest CMA (May 23, 2009 4:30 PM)  Physical Exam  General:  well-nourished in no acute distress Head:  HEENT normal Neck:  Neck supple, no JVD. No masses, thyromegaly or abnormal cervical nodes. Lungs:  clear to auscultate Heart:  heart rhythm is regular without murmurs or gallops S1 was somewhat loud   Abdomen:  soft nontender without hepatomegaly Extremities:  no edema Neurologic:  grossly normal   Impression & Recommendations:  Problem # 1:  WOLFF (WOLFE)-PARKINSON-WHITE (WPW) SYNDROME (ICD-426.7)   The patient has WPW and would like to pursue catheter ablation. We have again reviewed the potential benefits as well as potential risks including but not limited to heart block perforation a risks and would like to proceed.

## 2010-07-11 ENCOUNTER — Encounter: Payer: Self-pay | Admitting: Gastroenterology

## 2010-07-17 NOTE — Letter (Signed)
Summary: Colonoscopy Letter  [facility]  [address] [place], [place] [address]   Phone: [phone]  Fax: [phone]      July 11, 2010 MRN: [id number]   Danny Young [address] [place], [place]  [address]   Dear Mr. Macaraeg,   According to your medical record, it is time for you to schedule a Colonoscopy. The American Cancer Society recommends this procedure as a method to detect early colon cancer. Patients with a family history of colon cancer, or a personal history of colon polyps or inflammatory bowel disease are at increased risk.  This letter has been generated based on the recommendations made at the time of your procedure. If you feel that in your particular situation this may no longer apply, please contact our office.  Please call our office at [phone] to schedule this appointment or to update your records at your earliest convenience.  Thank you for cooperating with [place] to provide you with the very best care possible.   Sincerely,  Judie Petit T. Russella Dar, M.D.  [facility]ology Division [phone]

## 2010-09-10 ENCOUNTER — Other Ambulatory Visit: Payer: Self-pay | Admitting: Internal Medicine

## 2010-09-11 NOTE — Assessment & Plan Note (Signed)
[facility]                            CARDIOLOGY OFFICE NOTE   Danny Young, Danny Young                    MRN:          [id number]  DATE:12/24/2007                            DOB:          01/29/1951    Danny Young is a pleasant gentleman who has a history of WPW and SVT.  He was seen by Dr. Graciela Husbands in 1997.  He has not had a history of atrial  fibrillation.  He did discuss therapeutic options with Dr. Graciela Husbands in the  past including beta-blockers, antiarrhythmic and EP study with catheter  ablation.  He decided at that time to continue with beta-blockade and he  has continued this since.  Since I last saw him, he is doing well with  no dyspnea, chest pain, syncope or pedal edema.  He will have 1-2  episodes of palpitations per year.  These typically last 4-5 minutes and  resolve spontaneously.  He has not had any sustained palpitations  requiring hospitalization.   MEDICATIONS:  1. Crestor 10 mg p.o. daily.  2. Aspirin 81 mg p.o. daily.  3. Lopressor 50 mg p.o. b.i.d.   PHYSICAL EXAMINATION:  VITAL SIGNS:  Today shows a blood pressure of  106/67.  His pulse is 49.  Weigh is 171 pounds.  HEENT:  Normal.  NECK:  Supple.  CHEST:  Clear.  CARDIOVASCULAR:  Bradycardic rate but a regular rhythm.  ABDOMEN:  No tenderness.  EXTREMITIES:  No edema.   His electrocardiogram shows a marked sinus bradycardia rate of 48.  The  axis is normal.  There is a delta wave noted.  It is consist of WPW.   DIAGNOSES:  1. Wolff-Parkinson-White syndrome with history of supraventricular      tachycardia - we again discussed the options today including      continue Lopressor versus catheter ablation.  He is having a      paucity of the symptoms and he is comfortable with continuing with      this present regimen.  We will change his Lopressor to Toprol 100      mg p.o. b.i.d. for once daily dosing.  I will see him back in 1      year or sooner if necessary.  2.  Hyperlipidemia - continue on statin.  Dr. Jonny Ruiz is following this.     Madolyn Frieze Danny Som, MD, [facility]  Electronically Signed    BSC/MedQ  DD: 12/24/2007  DT: 12/25/2007  Job #: [id number]

## 2010-09-11 NOTE — Assessment & Plan Note (Signed)
[facility]                            CARDIOLOGY OFFICE NOTE   Danny Young, Danny Young                    MRN:          [id number]  DATE:12/05/2006                            DOB:          [birth year]    [name] is a 60 year old gentleman who was previously followed by  Dr. Glennon Young.  He has a history of WPW with SVT up to a rate of 210.  He did see Dr. Graciela Young in May 1997.  At that time it was noted that he had  not had atrial fibrillation.  Therapeutic options were discussed  including beta-blockers, antiarrhythmic therapy, and EP study with  catheter ablation.  They decided at that time to continue with beta-  blockade.  This has been effective in controlling his symptoms.  He will  rarely have brief palpitations.  They are not associated with chest pain  or shortness of breath and there has been no history of syncope.  Note,  he otherwise does not have dyspnea on exertion, orthopnea, PND, pedal  edema, or exertional chest pain.   PRESENT MEDICATIONS:  1. Crestor 10 mg p.o. daily.  2. Aspirin 81 mg p.o. daily.  3. Lopressor 50 mg p.o. b.i.d.   PHYSICAL EXAMINATION:  VITAL SIGNS:  Blood pressure of 114/65 and his  pulse is 56.  He weighs 181 pounds.  HEENT:  Normal.  NECK:  Supple and I cannot appreciate bruits.  CHEST:  Clear.  CARDIOVASCULAR:  Reveals a regular rate and rhythm.  ABDOMEN:  Benign.  There are no pulsatile masses or bruits.  EXTREMITIES:  Show no edema.   His electrocardiogram shows a sinus rhythm at a rate of 51.  There is a  delta wave in a short PR interval consistent with Wolff-Parkinson-White  syndrome.   DIAGNOSES:  1. Wolff-Parkinson-White syndrome with a history of supraventricular      tachycardia.  We have again discussed his Wolff-Parkinson-White      syndrome.  Given that his symptoms have been controlled on      Lopressor and they are not worse, we will continue with medical      therapy.  He will continue  on Lopressor at 50 mg by mouth twice a      day.  If his symptoms worsen in the future then we can refer back      to our electrophysiologist for consideration of ablation.  2. Hyperlipidemia.  The patient would prefer a less expensive Statin.      I will discontinue his Crestor and we will begin Zocor 40 mg by      mouth at bedtime.  We will check lipids and liver in 6 weeks and      adjust as indicated.  We will see him back in 1 year.     Danny Young Danny Som, MD, [facility]  Electronically Signed   BSC/MedQ  DD: 12/05/2006  DT: 12/05/2006  Job #: [id number]

## 2011-02-14 ENCOUNTER — Other Ambulatory Visit: Payer: Self-pay | Admitting: Internal Medicine

## 2011-03-17 ENCOUNTER — Other Ambulatory Visit: Payer: Self-pay | Admitting: Internal Medicine

## 2011-04-16 ENCOUNTER — Other Ambulatory Visit: Payer: Self-pay | Admitting: Internal Medicine

## 2011-05-28 ENCOUNTER — Other Ambulatory Visit: Payer: Self-pay | Admitting: Internal Medicine

## 2011-06-06 IMAGING — CR DG CERVICAL SPINE COMPLETE 4+V
5 series · 5 of 5 positions shown · non-contrast
Comparison: None.

CLINICAL DATA: Left neck pain.  No known injury.

CERVICAL SPINE - COMPLETE 4+ VIEW

[view not recorded (1 of 5)]
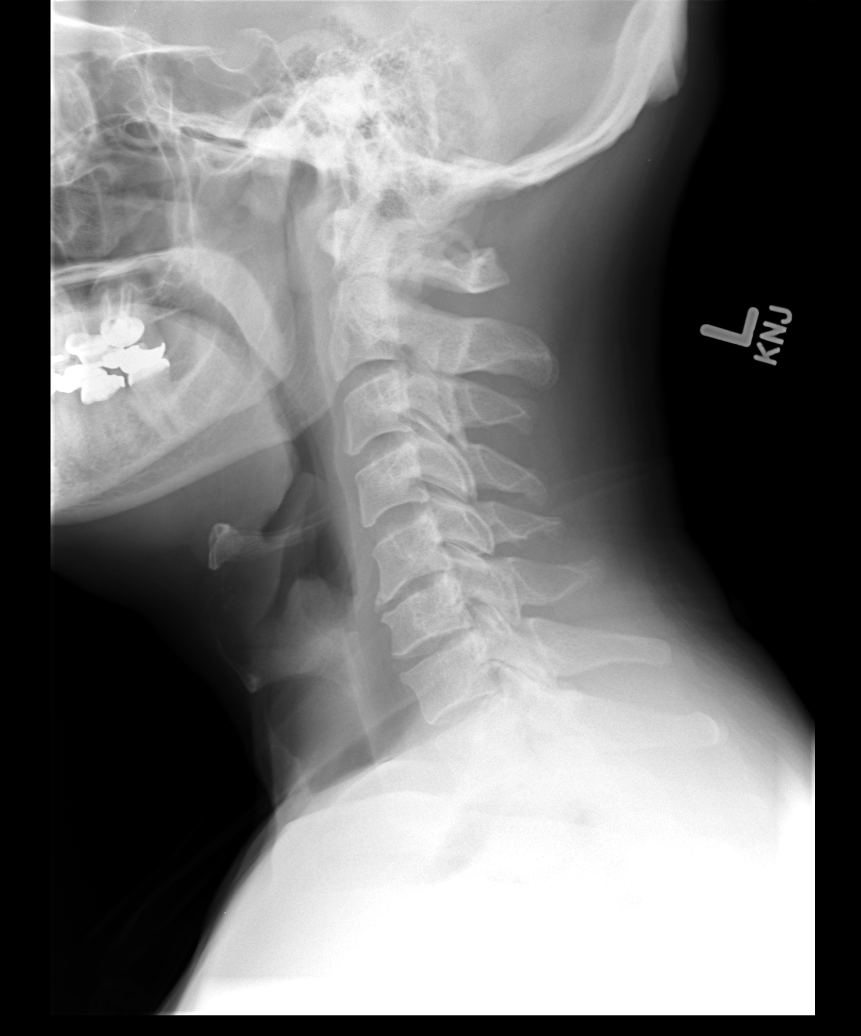

[view not recorded (2 of 5)]
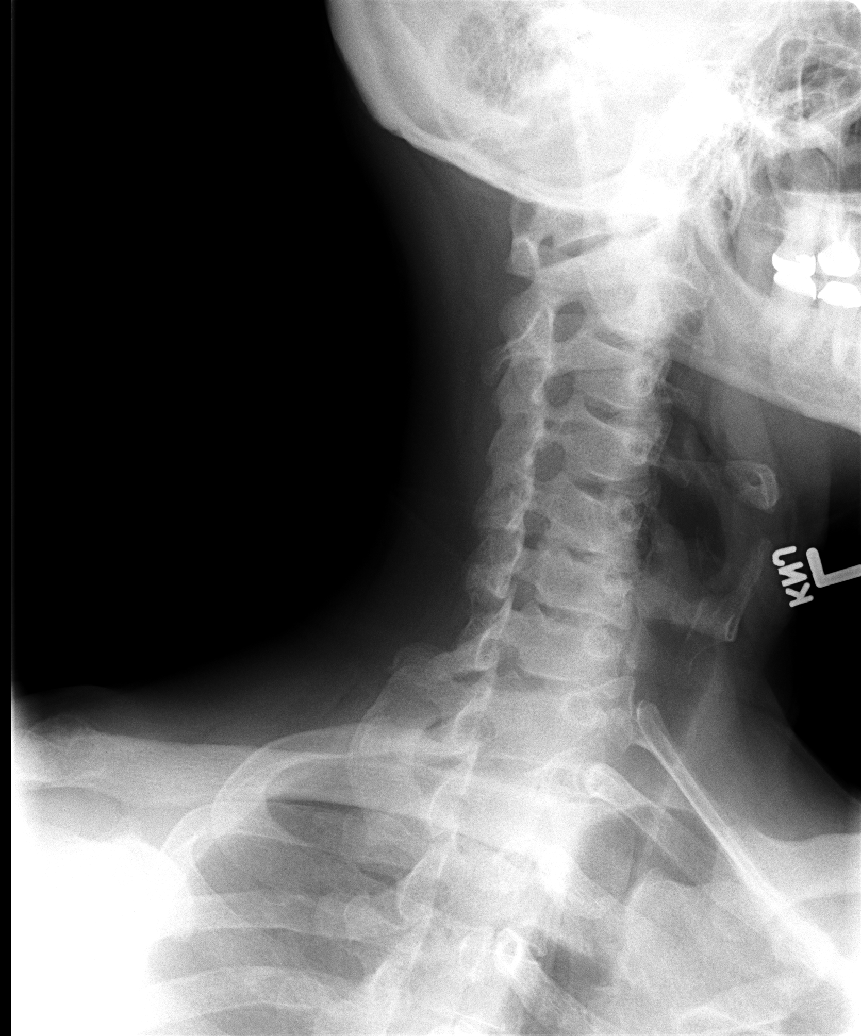

[view not recorded (3 of 5)]
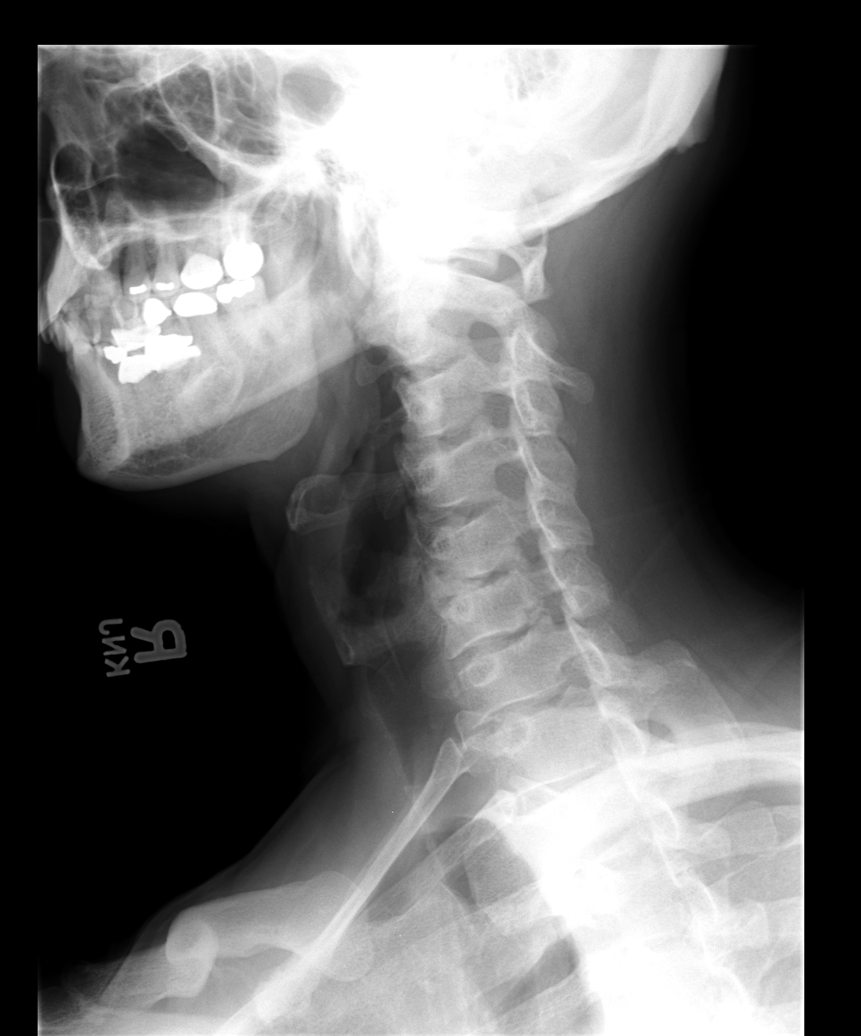

[view not recorded (4 of 5)]
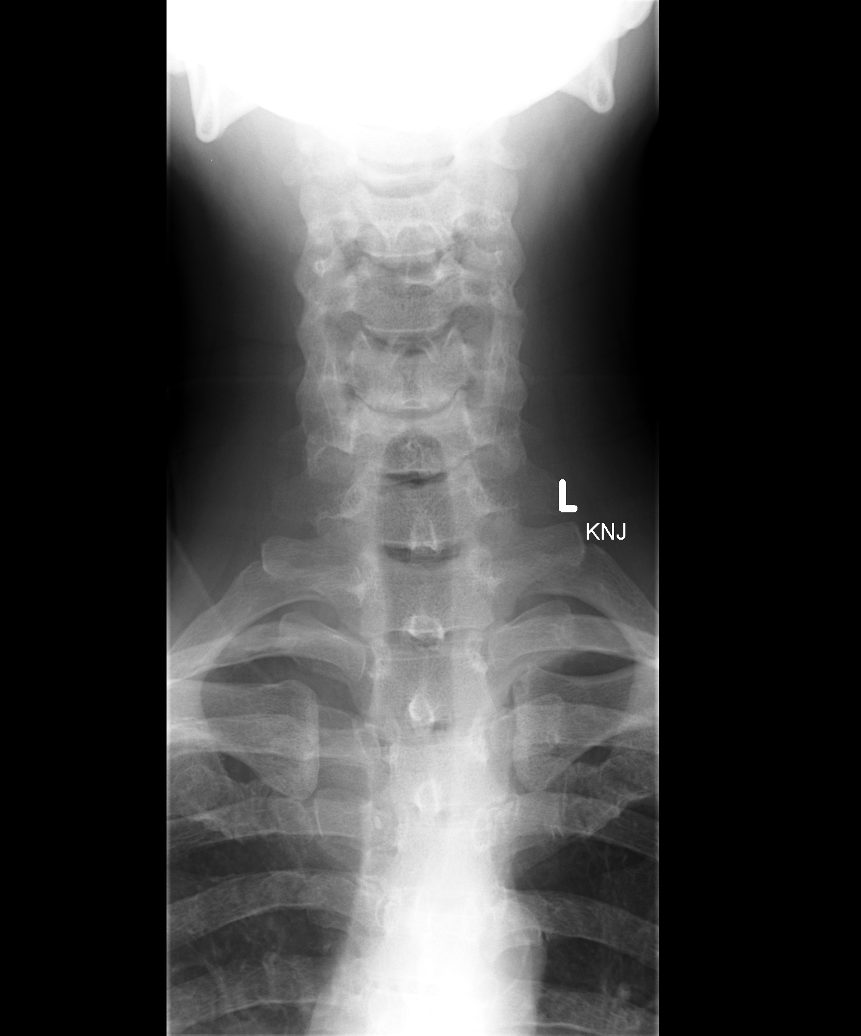

[view not recorded (5 of 5)]
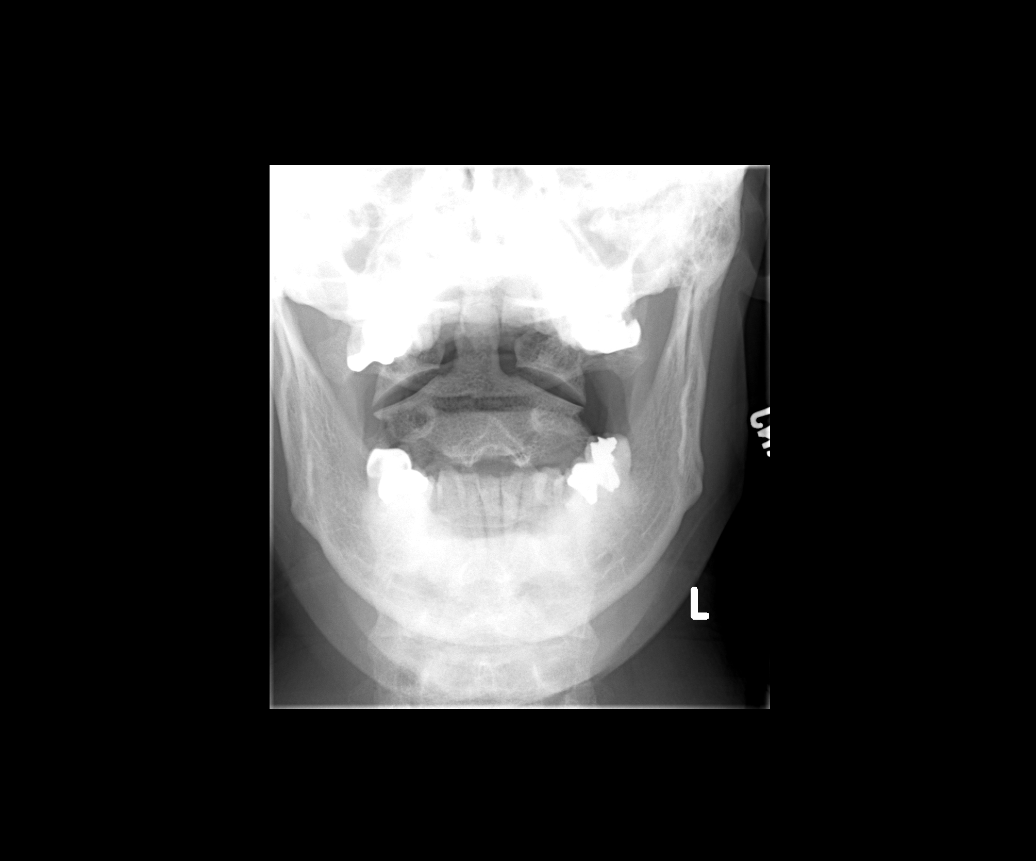

[5 of 5 positions shown; findings below may reference images not displayed]

FINDINGS: Mild to moderate anterior spur formation at the C5-6 and
C6-7 levels with associated disc space narrowing.  Uncinate spurs
producing mild to moderate bilateral foraminal stenosis at those
levels.  Normal alignment.
IMPRESSION: Degenerative changes, as described above.

## 2011-08-02 ENCOUNTER — Other Ambulatory Visit: Payer: Self-pay | Admitting: Internal Medicine

## 2011-08-20 ENCOUNTER — Ambulatory Visit: Payer: Self-pay | Admitting: Endocrinology

## 2011-10-22 ENCOUNTER — Encounter: Payer: Self-pay | Admitting: Internal Medicine

## 2011-10-22 ENCOUNTER — Other Ambulatory Visit (INDEPENDENT_AMBULATORY_CARE_PROVIDER_SITE_OTHER): Payer: BC Managed Care – PPO

## 2011-10-22 ENCOUNTER — Ambulatory Visit (INDEPENDENT_AMBULATORY_CARE_PROVIDER_SITE_OTHER): Payer: BC Managed Care – PPO | Admitting: Internal Medicine

## 2011-10-22 ENCOUNTER — Other Ambulatory Visit: Payer: Self-pay | Admitting: Internal Medicine

## 2011-10-22 VITALS — BP 140/82 | HR 61 | Temp 97.4°F | Ht 72.0 in | Wt 179.2 lb

## 2011-10-22 DIAGNOSIS — Z2911 Encounter for prophylactic immunotherapy for respiratory syncytial virus (RSV): Secondary | ICD-10-CM

## 2011-10-22 DIAGNOSIS — Z0000 Encounter for general adult medical examination without abnormal findings: Secondary | ICD-10-CM

## 2011-10-22 DIAGNOSIS — M47812 Spondylosis without myelopathy or radiculopathy, cervical region: Secondary | ICD-10-CM

## 2011-10-22 DIAGNOSIS — Z0001 Encounter for general adult medical examination with abnormal findings: Secondary | ICD-10-CM | POA: Insufficient documentation

## 2011-10-22 DIAGNOSIS — J309 Allergic rhinitis, unspecified: Secondary | ICD-10-CM

## 2011-10-22 DIAGNOSIS — R972 Elevated prostate specific antigen [PSA]: Secondary | ICD-10-CM

## 2011-10-22 DIAGNOSIS — I456 Pre-excitation syndrome: Secondary | ICD-10-CM

## 2011-10-22 DIAGNOSIS — E785 Hyperlipidemia, unspecified: Secondary | ICD-10-CM

## 2011-10-22 DIAGNOSIS — Z23 Encounter for immunization: Secondary | ICD-10-CM

## 2011-10-22 HISTORY — DX: Spondylosis without myelopathy or radiculopathy, cervical region: M47.812

## 2011-10-22 LAB — HEPATIC FUNCTION PANEL
ALT: 19 U/L (ref 0–53)
AST: 23 U/L (ref 0–37)
Total Bilirubin: 1 mg/dL (ref 0.3–1.2)

## 2011-10-22 LAB — CBC WITH DIFFERENTIAL/PLATELET
Eosinophils Absolute: 0.1 10*3/uL (ref 0.0–0.7)
MCHC: 33.8 g/dL (ref 30.0–36.0)
MCV: 93.9 fl (ref 78.0–100.0)
Monocytes Absolute: 0.4 10*3/uL (ref 0.1–1.0)
Neutrophils Relative %: 57.7 % (ref 43.0–77.0)
Platelets: 176 10*3/uL (ref 150.0–400.0)
WBC: 5.3 10*3/uL (ref 4.5–10.5)

## 2011-10-22 LAB — URINALYSIS, ROUTINE W REFLEX MICROSCOPIC
Bilirubin Urine: NEGATIVE
Hgb urine dipstick: NEGATIVE
Leukocytes, UA: NEGATIVE
Nitrite: NEGATIVE

## 2011-10-22 LAB — BASIC METABOLIC PANEL
BUN: 23 mg/dL (ref 6–23)
Chloride: 104 mEq/L (ref 96–112)
Potassium: 5.8 mEq/L — ABNORMAL HIGH (ref 3.5–5.1)

## 2011-10-22 LAB — TSH: TSH: 0.8 u[IU]/mL (ref 0.35–5.50)

## 2011-10-22 LAB — LIPID PANEL
Cholesterol: 170 mg/dL (ref 0–200)
LDL Cholesterol: 111 mg/dL — ABNORMAL HIGH (ref 0–99)

## 2011-10-22 MED ORDER — LEVOCETIRIZINE DIHYDROCHLORIDE 5 MG PO TABS: 5.0000 mg | ORAL_TABLET | Freq: Every day | ORAL | Status: DC | PRN

## 2011-10-22 MED ORDER — FLUTICASONE PROPIONATE 50 MCG/ACT NA SUSP: 2.0000 | Freq: Every day | NASAL | Status: DC

## 2011-10-22 MED ORDER — ASPIRIN 81 MG PO TBEC: 81.0000 mg | DELAYED_RELEASE_TABLET | Freq: Every day | ORAL | Status: AC

## 2011-10-22 MED ORDER — SIMVASTATIN 40 MG PO TABS: 40.0000 mg | ORAL_TABLET | Freq: Every day | ORAL | Status: DC

## 2011-10-22 NOTE — Assessment & Plan Note (Signed)
Mild uncontrolled, to add flonase asd

## 2011-10-22 NOTE — Patient Instructions (Signed)
Take all new medications as prescribed - the generic flonase for the allergies Continue all other medications as before, including re-starting of the simvastatin for cholesterol Your refills were all sent today for the next year You are otherwise up to date with prevention You had the shingles shot today Please go to LAB in the Basement for the blood and/or urine tests to be done today You will be contacted by phone if any changes need to be made immediately.  Otherwise, you will receive a letter about your results with an explanation. Please return in 1 year for your yearly visit, or sooner if needed, with Lab testing done 3-5 days before

## 2011-10-22 NOTE — Assessment & Plan Note (Addendum)
Overall doing well, age appropriate education and counseling updated, referrals for preventative services and immunizations addressed, dietary and smoking counseling addressed, most recent labs and ECG reviewed.  I have personally reviewed and have noted: 1) the patient's medical and social history 2) The pt's use of alcohol, tobacco, and illicit drugs 3) The patient's current medications and supplements 4) Functional ability including ADL's, fall risk, home safety risk, hearing and visual impairment 5) Diet and physical activities 6) Evidence for depression or mood disorder 7) The patient's height, weight, and BMI have been recorded in the chart I have made referrals, and provided counseling and education based on review of the above ECG reviewed as per emr, for shingles shot, routine labs

## 2011-10-22 NOTE — Addendum Note (Signed)
Addended by: Scharlene Gloss B on: 10/22/2011 09:36 AM   Modules accepted: Orders

## 2011-10-22 NOTE — Progress Notes (Signed)
Subjective:    Patient ID: Danny Young, male    DOB: Apr 05, 1951, 61 y.o.   MRN: [id number]  HPI  Here for wellness and f/u;  Overall doing ok;  Pt denies CP, worsening SOB, DOE, wheezing, orthopnea, PND, worsening LE edema, palpitations, dizziness or syncope.  Pt denies neurological change such as new Headache, facial or extremity weakness.  Pt denies polydipsia, polyuria, or low sugar symptoms. Pt states overall good compliance with treatment and medications, good tolerability, and trying to follow lower cholesterol diet.  Pt denies worsening depressive symptoms, suicidal ideation or panic. No fever, wt loss, night sweats, loss of appetite, or other constitutional symptoms.  Pt states good ability with ADL's, low fall risk, home safety reviewed and adequate, no significant changes in hearing or vision, and occasionally active with exercise.  Still having signicfcant nasal allergy congestion despite the xyzal.  Has  Been out of the zocor for 1 mo.  Needs labs today, and due for shingles shot Past Medical History  Diagnosis Date  . 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 HYPERLIPIDEMIA 01/19/2007    Qualifier: Diagnosis of  By: Jonny Ruiz MD, Len Blalock   . GERD 01/19/2007    Qualifier: Diagnosis of  By: Jonny Ruiz MD, Len Blalock   . ALLERGIC RHINITIS 01/19/2007    Qualifier: Diagnosis of  By: Jonny Ruiz MD, Len Blalock   . MIGRAINE HEADACHE 01/19/2007    Qualifier: Diagnosis of  By: Jonny Ruiz MD, Len Blalock   . WOLFF (WOLFE)-PARKINSON-WHITE (WPW) SYNDROME 01/19/2007    S/p ablation 2011 , Dr Klein/card    Past Surgical History  Procedure Date  . Rotater cuff     left    reports that he has never smoked. He has never used smokeless tobacco. He reports that he drinks alcohol. He reports that he does not use illicit drugs. family history includes Hypertension in his father and mother. No Known Allergies Current Outpatient Prescriptions on File Prior to Visit  Medication Sig Dispense Refill  . levocetirizine (XYZAL) 5 MG  tablet TAKE 1 TABLET BY MOUTH DAILY AS NEEDED FOR ALLERGIES  30 tablet  1  . simvastatin (ZOCOR) 40 MG tablet TAKE 1 TABLET BY MOUTH EVERY DAY  30 tablet  1   Review of Systems Review of Systems  Constitutional: Negative for diaphoresis, activity change, appetite change and unexpected weight change.  HENT: Negative for hearing loss, ear pain, facial swelling, mouth sores and neck stiffness.   Eyes: Negative for pain, redness and visual disturbance.  Respiratory: Negative for shortness of breath and wheezing.   Cardiovascular: Negative for chest pain and palpitations.  Gastrointestinal: Negative for diarrhea, blood in stool, abdominal distention and rectal pain.  Genitourinary: Negative for hematuria, flank pain and decreased urine volume.  Musculoskeletal: Negative for myalgias and joint swelling.  Skin: Negative for color change and wound.  Neurological: Negative for syncope and numbness.  Hematological: Negative for adenopathy.  Psychiatric/Behavioral: Negative for hallucinations, self-injury, decreased concentration and agitation.      Objective:   Physical Exam BP 140/82  Pulse 61  Temp 97.4 F (36.3 C) (Oral)  Ht 6' (1.829 m)  Wt 179 lb 4 oz (81.307 kg)  BMI 24.31 kg/m2  SpO2 98% Physical Exam  VS noted Constitutional: Pt is oriented to person, place, and time. Appears well-developed and well-nourished.  HENT:  Head: Normocephalic and atraumatic.  Right Ear: External ear normal.  Left Ear: External ear normal.  Nose: Nose normal.  Mouth/Throat: Oropharynx is clear and moist.  Eyes: Conjunctivae and EOM are normal. Pupils are equal, round, and reactive to light.  Neck: Normal range of motion. Neck supple. No JVD present. No tracheal deviation present.  Cardiovascular: Normal rate, regular rhythm, normal heart sounds and intact distal pulses.   Pulmonary/Chest: Effort normal and breath sounds normal.  Abdominal: Soft. Bowel sounds are normal. There is no tenderness.    Musculoskeletal: Normal range of motion. Exhibits no edema.  Lymphadenopathy:  Has no cervical adenopathy.  Neurological: Pt is alert and oriented to person, place, and time. Pt has normal reflexes. No cranial nerve deficit.  Skin: Skin is warm and dry. No rash noted.  Psychiatric:  Has  normal mood and affect. Behavior is normal.     Assessment & Plan:

## 2011-10-22 NOTE — Assessment & Plan Note (Signed)
Out of zocor for 1 mo, to re-start, follow lower chol diet

## 2011-10-26 ENCOUNTER — Other Ambulatory Visit: Payer: Self-pay | Admitting: Internal Medicine

## 2011-12-24 ENCOUNTER — Other Ambulatory Visit: Payer: Self-pay

## 2011-12-24 DIAGNOSIS — I456 Pre-excitation syndrome: Secondary | ICD-10-CM

## 2011-12-24 MED ORDER — SIMVASTATIN 40 MG PO TABS: 40.0000 mg | ORAL_TABLET | Freq: Every day | ORAL | Status: DC

## 2011-12-24 MED ORDER — LEVOCETIRIZINE DIHYDROCHLORIDE 5 MG PO TABS: 5.0000 mg | ORAL_TABLET | Freq: Every day | ORAL | Status: DC | PRN

## 2011-12-24 MED ORDER — FLUTICASONE PROPIONATE 50 MCG/ACT NA SUSP: 2.0000 | Freq: Every day | NASAL | Status: DC

## 2012-01-15 ENCOUNTER — Encounter: Payer: Self-pay | Admitting: Gastroenterology

## 2012-07-22 ENCOUNTER — Emergency Department (HOSPITAL_COMMUNITY)
Admission: EM | Admit: 2012-07-22 | Discharge: 2012-07-22 | Disposition: A | Discharge status: Home / Self Care | Payer: BC Managed Care – PPO | Attending: Emergency Medicine | Admitting: Emergency Medicine

## 2012-07-22 ENCOUNTER — Encounter (HOSPITAL_COMMUNITY): Payer: Self-pay | Admitting: Family Medicine

## 2012-07-22 ENCOUNTER — Emergency Department (HOSPITAL_COMMUNITY): Payer: BC Managed Care – PPO

## 2012-07-22 ENCOUNTER — Telehealth: Payer: Self-pay | Admitting: Internal Medicine

## 2012-07-22 DIAGNOSIS — N200 Calculus of kidney: Secondary | ICD-10-CM

## 2012-07-22 DIAGNOSIS — E785 Hyperlipidemia, unspecified: Secondary | ICD-10-CM | POA: Insufficient documentation

## 2012-07-22 DIAGNOSIS — Z8719 Personal history of other diseases of the digestive system: Secondary | ICD-10-CM | POA: Insufficient documentation

## 2012-07-22 DIAGNOSIS — Z8679 Personal history of other diseases of the circulatory system: Secondary | ICD-10-CM | POA: Insufficient documentation

## 2012-07-22 DIAGNOSIS — Z79899 Other long term (current) drug therapy: Secondary | ICD-10-CM | POA: Insufficient documentation

## 2012-07-22 DIAGNOSIS — Z8739 Personal history of other diseases of the musculoskeletal system and connective tissue: Secondary | ICD-10-CM | POA: Insufficient documentation

## 2012-07-22 DIAGNOSIS — J309 Allergic rhinitis, unspecified: Secondary | ICD-10-CM | POA: Insufficient documentation

## 2012-07-22 DIAGNOSIS — R112 Nausea with vomiting, unspecified: Secondary | ICD-10-CM | POA: Insufficient documentation

## 2012-07-22 LAB — BASIC METABOLIC PANEL
CO2: 27 mEq/L (ref 19–32)
Chloride: 104 mEq/L (ref 96–112)
Creatinine, Ser: 1.32 mg/dL (ref 0.50–1.35)
Potassium: 4.2 mEq/L (ref 3.5–5.1)
Sodium: 143 mEq/L (ref 135–145)

## 2012-07-22 LAB — URINALYSIS, ROUTINE W REFLEX MICROSCOPIC
Glucose, UA: NEGATIVE mg/dL
Leukocytes, UA: NEGATIVE
Nitrite: NEGATIVE
Specific Gravity, Urine: 1.019 (ref 1.005–1.030)
pH: 7 (ref 5.0–8.0)

## 2012-07-22 LAB — CBC WITH DIFFERENTIAL/PLATELET
Basophils Absolute: 0 10*3/uL (ref 0.0–0.1)
HCT: 45.1 % (ref 39.0–52.0)
Lymphocytes Relative: 22 % (ref 12–46)
Monocytes Absolute: 0.5 10*3/uL (ref 0.1–1.0)
Neutro Abs: 5.5 10*3/uL (ref 1.7–7.7)
Neutrophils Relative %: 71 % (ref 43–77)
RDW: 11.9 % (ref 11.5–15.5)
WBC: 7.7 10*3/uL (ref 4.0–10.5)

## 2012-07-22 MED ORDER — OXYCODONE-ACETAMINOPHEN 5-325 MG PO TABS: 1.0000 | ORAL_TABLET | ORAL | Status: DC | PRN

## 2012-07-22 MED ORDER — HYDROMORPHONE HCL PF 1 MG/ML IJ SOLN
INTRAMUSCULAR | Status: AC
  Administered 2012-07-22: 1 mg
  Filled 2012-07-22: qty 1

## 2012-07-22 MED ORDER — HYDROMORPHONE HCL PF 1 MG/ML IJ SOLN
1.0000 mg | Freq: Once | INTRAMUSCULAR | Status: AC
  Administered 2012-07-22: 1 mg via INTRAVENOUS
  Filled 2012-07-22: qty 1

## 2012-07-22 MED ORDER — SODIUM CHLORIDE 0.9 % IV BOLUS (SEPSIS)
1000.0000 mL | Freq: Once | INTRAVENOUS | Status: AC
  Administered 2012-07-22: 1000 mL via INTRAVENOUS

## 2012-07-22 MED ORDER — ONDANSETRON 4 MG PO TBDP: 8.0000 mg | ORAL_TABLET | Freq: Three times a day (TID) | ORAL | Status: DC | PRN

## 2012-07-22 MED ORDER — CYCLOBENZAPRINE HCL 10 MG PO TABS: 10.0000 mg | ORAL_TABLET | Freq: Two times a day (BID) | ORAL | Status: DC | PRN

## 2012-07-22 MED ORDER — TAMSULOSIN HCL 0.4 MG PO CAPS
0.4000 mg | ORAL_CAPSULE | Freq: Once | ORAL | Status: AC
  Administered 2012-07-22: 0.4 mg via ORAL
  Filled 2012-07-22: qty 1

## 2012-07-22 MED ORDER — KETOROLAC TROMETHAMINE 30 MG/ML IJ SOLN
30.0000 mg | Freq: Once | INTRAMUSCULAR | Status: AC
  Administered 2012-07-22: 30 mg via INTRAVENOUS
  Filled 2012-07-22: qty 1

## 2012-07-22 NOTE — Telephone Encounter (Signed)
Patient Information:  Caller Name: Danny Young  Phone: [phone]  Patient: Danny Young, Danny Young  Gender: Male  DOB: 02-Jan-1951  Age: 62 Years  PCP: Oliver Barre (Adults only)  Office Follow Up:  Does the office need to follow up with this patient?: No  Instructions For The Office: N/A   Symptoms  Reason For Call & Symptoms: Pt is having severe pain back now when he urinates. Onset 1 hour ago. He is in excrutiating pain when he urinates which has been several times in the past hour.. No fever. He cannot stand up. Pts wife is speaking for him. She states pt is pale/sweaty/weak. Asking if she should take him to ED. Pt could not speak for himself.  Reviewed Health History In EMR: Yes  Reviewed Medications In EMR: Yes  Reviewed Allergies In EMR: Yes  Reviewed Surgeries / Procedures: Yes  Date of Onset of Symptoms: 07/22/2012  Guideline(s) Used:  Back Pain  Disposition Per Guideline:   Call EMS 911 Now  Reason For Disposition Reached:   Shock suspected (e.g., cold/pale/clammy skin, too weak to stand)  Advice Given:  N/A  RN Overrode Recommendation:  Go To ED  Pts wife is with him and will take him to ED

## 2012-07-22 NOTE — ED Provider Notes (Signed)
History     CSN: [id number]  Arrival date & time 07/22/12  2956   First MD Initiated Contact with Patient 07/22/12 1014      Chief Complaint  Patient presents with  . Flank Pain    (Consider location/radiation/quality/duration/timing/severity/associated sxs/prior treatment) HPI Comments: Patient is a 62 year old male who presents with sudden onset of left flank pain that occurred when he was urinating this morning. The pain is located in his left flank and does radiate into his left groin. The pain is described as sharp and severe. The pain started suddenly and progressively worsened since the onset. No alleviating/aggravating factors. The pain is not positional. The patient has tried nothing for symptoms without relief. Associated symptoms include nausea and vomiting. Patient denies fever, headache, diarrhea, chest pain, SOB, dysuria, constipation.    Past Medical History  Diagnosis Date  . 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 HYPERLIPIDEMIA 01/19/2007    Qualifier: Diagnosis of  By: Jonny Ruiz MD, Len Blalock   . GERD 01/19/2007    Qualifier: Diagnosis of  By: Jonny Ruiz MD, Len Blalock   . ALLERGIC RHINITIS 01/19/2007    Qualifier: Diagnosis of  By: Jonny Ruiz MD, Len Blalock   . MIGRAINE HEADACHE 01/19/2007    Qualifier: Diagnosis of  By: Jonny Ruiz MD, Len Blalock   . 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 Rotater cuff      left    Family History  Problem Relation Age of Onset  . Hypertension Mother   . Hypertension Father     History  Substance Use Topics  . Smoking status: Never Smoker   . Smokeless tobacco: Never Used  . Alcohol Use: Yes     Comment: very occasional wine      Review of Systems  Genitourinary: Positive for flank pain.  All other systems reviewed and are negative.    Allergies  Review of patient's allergies indicates no known allergies.  Home Medications    Current Outpatient Rx  Name  Route  Sig  Dispense  Refill  . aspirin 81 MG EC tablet   Oral   Take 1 tablet (81 mg total) by mouth daily. Swallow whole.   30 tablet   12   . fish oil-omega-3 fatty acids 1000 MG capsule   Oral   Take 1 g by mouth daily.         . fluticasone (FLONASE) 50 MCG/ACT nasal spray   Nasal   Place 2 sprays into the nose daily.   48 g   3   . levocetirizine (XYZAL) 5 MG tablet   Oral   Take 1 tablet (5 mg total) by mouth daily as needed for allergies.   90 tablet   3   . simvastatin (ZOCOR) 40 MG tablet   Oral   Take 1 tablet (40 mg total) by mouth at bedtime.   90 tablet   3     BP 171/81  Pulse 72  Temp(Src) 98 F (36.7 C)  Resp 18  SpO2 100%  Physical Exam  Nursing note and vitals reviewed. Constitutional: He is oriented to person, place, and time. He appears well-developed and well-nourished. No distress.  HENT:  Head: Normocephalic and atraumatic.  Eyes: Conjunctivae are normal.  Neck: Normal range of motion. Neck supple.  Cardiovascular: Normal rate and regular rhythm.  Exam reveals no gallop and no friction rub.  No murmur heard. Pulmonary/Chest: Effort normal and breath sounds normal. He has no wheezes. He has no rales. He exhibits no tenderness.  Abdominal: Soft. He exhibits no distension. There is no tenderness. There is no rebound and no guarding.  Genitourinary:  No CVA tenderness.   Musculoskeletal: Normal range of motion.  Neurological: He is alert and oriented to person, place, and time. Coordination normal.  Speech is goal-oriented. Moves limbs without ataxia.   Skin: Skin is warm and dry.  Psychiatric: He has a normal mood and affect. His behavior is normal.    ED Course  Procedures (including critical care time)  Labs Reviewed  BASIC METABOLIC PANEL - Abnormal; Notable for the following:    Glucose, Bld 137 (*)    GFR calc non Af Amer 57 (*)    GFR calc Af Amer 66 (*)    All other components within  normal limits  URINALYSIS, ROUTINE W REFLEX MICROSCOPIC - Abnormal; Notable for the following:    Hgb urine dipstick TRACE (*)    All other components within normal limits  URINE MICROSCOPIC-ADD ON - Abnormal; Notable for the following:    Casts HYALINE CASTS (*)    All other components within normal limits  URINE CULTURE  CBC WITH DIFFERENTIAL   Ct Abdomen Pelvis Wo Contrast  07/22/2012  *RADIOLOGY REPORT*  Clinical Data: Left-sided flank pain, microscopic hematuria, nausea and vomiting.  CT ABDOMEN AND PELVIS WITHOUT CONTRAST  Technique:  Multidetector CT imaging of the abdomen and pelvis was performed following the standard protocol without intravenous contrast.  Comparison: None.  Findings: There is evidence of moderate left-sided hydronephrosis and hydroureter.  A tiny calculus located either at the left ureterovesicle junction or just in the bladder lumen measures approximately 3 mm.  No other ureteral or renal calculi are identified.  Other unenhanced structures in the abdomen and pelvis are unremarkable and show no acute findings.  Bowel loops are unremarkable.  There is no evidence of mass, enlarged lymph nodes or hernias.  Bony structures are unremarkable.  IMPRESSION: Left-sided hydronephrosis and hydroureter due to a 3 mm calculus located either at the left ureterovesicle junction or just in the bladder lumen.   Original Report Authenticated By: Irish Lack, M.D.      1. Kidney stone on left side       MDM  10:54 AM Labs and urinalysis pending. Patient will have fluids and dilaudid for pain.   12:35 PM Labs unremarkable. No signs of renal compromise on labs. Urine shows trace hemoglobin but no infection. Patient will have CT abdomen pelvis to rule out kidney stone. Patient feeling better after dilaudid.   1:48 PM Patient informed of results. I will give him fluids, toradol and flomax here. Patient will be discharged with a strainer for urine and medication for pain and  nausea. Patient is agreeable to plan and understands. Vitals stable and patient is afebrile.         Emilia Beck, PA-C 07/22/12 1451

## 2012-07-22 NOTE — ED Notes (Signed)
Pt return from CT.

## 2012-07-22 NOTE — ED Provider Notes (Signed)
Medical screening examination/treatment/procedure(s) were performed by non-physician practitioner and as supervising physician I was immediately available for consultation/collaboration.   Carleene Cooper III, MD 07/22/12 2131

## 2012-07-22 NOTE — ED Notes (Signed)
Patient transported to CT 

## 2012-07-22 NOTE — ED Notes (Signed)
Per pt left flank pain that started this am associated with N,V. sts pain radiates into groin.

## 2012-07-23 LAB — URINE CULTURE
Colony Count: NO GROWTH
Special Requests: NORMAL

## 2012-12-18 ENCOUNTER — Other Ambulatory Visit: Payer: Self-pay | Admitting: Internal Medicine

## 2013-01-06 ENCOUNTER — Ambulatory Visit (INDEPENDENT_AMBULATORY_CARE_PROVIDER_SITE_OTHER): Payer: BC Managed Care – PPO | Admitting: Internal Medicine

## 2013-01-06 ENCOUNTER — Encounter: Payer: Self-pay | Admitting: Internal Medicine

## 2013-01-06 VITALS — BP 134/88 | HR 57 | Temp 98.2°F | Ht 72.0 in | Wt 175.5 lb

## 2013-01-06 DIAGNOSIS — Z23 Encounter for immunization: Secondary | ICD-10-CM

## 2013-01-06 DIAGNOSIS — Z0000 Encounter for general adult medical examination without abnormal findings: Secondary | ICD-10-CM

## 2013-01-06 DIAGNOSIS — E785 Hyperlipidemia, unspecified: Secondary | ICD-10-CM

## 2013-01-06 DIAGNOSIS — M722 Plantar fascial fibromatosis: Secondary | ICD-10-CM

## 2013-01-06 MED ORDER — LEVOCETIRIZINE DIHYDROCHLORIDE 5 MG PO TABS: 5.0000 mg | ORAL_TABLET | Freq: Every day | ORAL | Status: DC | PRN

## 2013-01-06 MED ORDER — FLUTICASONE PROPIONATE 50 MCG/ACT NA SUSP: NASAL | Status: DC

## 2013-01-06 MED ORDER — SIMVASTATIN 40 MG PO TABS: 40.0000 mg | ORAL_TABLET | Freq: Every day | ORAL | Status: DC

## 2013-01-06 NOTE — Patient Instructions (Addendum)
You had the flu shot today Please continue all other medications as before, and refills have been done if requested. Please have the pharmacy call with any other refills you may need. Please continue your efforts at being more active, low cholesterol diet, and weight control. You are otherwise up to date with prevention measures today. Please go to the LAB in the Basement (turn left off the elevator) for the tests to be done in 4 weeks You will be contacted by phone if any changes need to be made immediately.  Otherwise, you will receive a letter about your results with an explanation, but please check with MyChart first.  Please keep your appointments with your specialists as you may have planned  Please wear shoe insert, soft soled shoes, avoid walkiing uphill, advill as needed for pain, and a right foot brace nightly to help with the right foot pain, and call for referral if not improved in 1-2 wks  Please return in 1 year for your yearly visit, or sooner if needed, with Lab testing done 3-5 days before

## 2013-01-06 NOTE — Assessment & Plan Note (Signed)

## 2013-01-06 NOTE — Progress Notes (Signed)
Subjective:    Patient ID: Danny Young, male    DOB: 09/12/1950, 62 y.o.   MRN: [id number]  HPI  Here for wellness and f/u;  Overall doing ok;  Pt denies CP, worsening SOB, DOE, wheezing, orthopnea, PND, worsening LE edema, palpitations, dizziness or syncope.  Pt denies neurological change such as new headache, facial or extremity weakness.  Pt denies polydipsia, polyuria, or low sugar symptoms. Pt states overall good compliance with treatment and medications, good tolerability, and has been trying to follow lower cholesterol diet.  Pt denies worsening depressive symptoms, suicidal ideation or panic. No fever, night sweats, wt loss, loss of appetite, or other constitutional symptoms.  Pt states good ability with ADL's, has low fall risk, home safety reviewed and adequate, no other significant changes in hearing or vision, and very active at work with walking, standing, in fact now with right pain near the plantar heel, worse in the am, some tender to touch, worse in the past 2 mo.  Just had PSA done per urology approx 2 mo, PSA improved, no f/u planned, will be due for psa again 2015 Past Medical History  Diagnosis Date  . 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 HYPERLIPIDEMIA 01/19/2007    Qualifier: Diagnosis of  By: Jonny Ruiz MD, Len Blalock   . GERD 01/19/2007    Qualifier: Diagnosis of  By: Jonny Ruiz MD, Len Blalock   . ALLERGIC RHINITIS 01/19/2007    Qualifier: Diagnosis of  By: Jonny Ruiz MD, Len Blalock   . MIGRAINE HEADACHE 01/19/2007    Qualifier: Diagnosis of  By: Jonny Ruiz MD, Len Blalock   . 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 Rotater cuff      left    reports that he has never smoked. He has never used smokeless tobacco. He reports that  drinks alcohol. He reports that he does not use illicit drugs. family history includes Hypertension in his father and mother. No Known Allergies No current  outpatient prescriptions on file prior to visit.   No current facility-administered medications on file prior to visit.     Review of Systems Constitutional: Negative for diaphoresis, activity change, appetite change or unexpected weight change.  HENT: Negative for hearing loss, ear pain, facial swelling, mouth sores and neck stiffness.   Eyes: Negative for pain, redness and visual disturbance.  Respiratory: Negative for shortness of breath and wheezing.   Cardiovascular: Negative for chest pain and palpitations.  Gastrointestinal: Negative for diarrhea, blood in stool, abdominal distention or other pain Genitourinary: Negative for hematuria, flank pain or change in urine volume.  Musculoskeletal: Negative for myalgias and joint swelling.  Skin: Negative for color change and wound.  Neurological: Negative for syncope and numbness. other than noted Hematological: Negative for adenopathy.  Psychiatric/Behavioral: Negative for hallucinations, self-injury, decreased concentration and agitation.      Objective:   Physical Exam BP 134/88  Pulse 57  Temp(Src) 98.2 F (36.8 C) (Oral)  Ht 6' (1.829 m)  Wt 175 lb 8 oz (79.606 kg)  BMI 23.8 kg/m2  SpO2 98% VS noted,  Constitutional: Pt is oriented to person, place, and time. Appears well-developed and well-nourished.  Head: Normocephalic and atraumatic.  Right Ear: External ear normal.  Left Ear: External ear normal.  Nose: Nose normal.  Mouth/Throat: Oropharynx is clear and moist.  Eyes: Conjunctivae and EOM are normal. Pupils are equal, round, and reactive to  light.  Neck: Normal range of motion. Neck supple. No JVD present. No tracheal deviation present.  Cardiovascular: Normal rate, regular rhythm, normal heart sounds and intact distal pulses.   Pulmonary/Chest: Effort normal and breath sounds normal.  Abdominal: Soft. Bowel sounds are normal. There is no tenderness. No HSM  Musculoskeletal: Normal range of motion. Exhibits no  edema.  Lymphadenopathy:  Has no cervical adenopathy.  Neurological: Pt is alert and oriented to person, place, and time. Pt has normal reflexes. No cranial nerve deficit.  Skin: Skin is warm and dry. No rash noted.  Psychiatric:  Has  normal mood and affect. Behavior is normal.  Mild tender right plantar heel without sweling, skin change    Assessment & Plan:

## 2013-01-06 NOTE — Assessment & Plan Note (Signed)
Not taking the statin for several wks having run out, ok for restart, check labs 4 wks

## 2013-01-06 NOTE — Assessment & Plan Note (Signed)
For soft soled shoes, shoe inserts, day exercise, night foot splint, advil prn, consider refer to Dr Katrinka Blazing in our office - [facility]

## 2013-01-19 ENCOUNTER — Telehealth: Payer: Self-pay

## 2013-01-19 MED ORDER — CETIRIZINE HCL 10 MG PO TABS: 10.0000 mg | ORAL_TABLET | Freq: Every day | ORAL | Status: DC

## 2013-01-19 NOTE — Telephone Encounter (Signed)
Ok for change to zyrtec asd - done erx

## 2013-01-19 NOTE — Telephone Encounter (Signed)
Patient called and stated that his insurance will not cover the Xyzal. It does not require a prior authorization. Patient is requesting something else be called in...ds,cma

## 2013-02-03 ENCOUNTER — Other Ambulatory Visit (INDEPENDENT_AMBULATORY_CARE_PROVIDER_SITE_OTHER): Payer: BC Managed Care – PPO

## 2013-02-03 DIAGNOSIS — Z0000 Encounter for general adult medical examination without abnormal findings: Secondary | ICD-10-CM

## 2013-02-03 LAB — CBC WITH DIFFERENTIAL/PLATELET
Basophils Relative: 0.4 % (ref 0.0–3.0)
Eosinophils Absolute: 0.1 10*3/uL (ref 0.0–0.7)
Eosinophils Relative: 1.4 % (ref 0.0–5.0)
Lymphocytes Relative: 30.1 % (ref 12.0–46.0)
MCHC: 34.7 g/dL (ref 30.0–36.0)
MCV: 92.1 fl (ref 78.0–100.0)
Monocytes Absolute: 0.4 10*3/uL (ref 0.1–1.0)
Neutrophils Relative %: 61 % (ref 43.0–77.0)
Platelets: 160 10*3/uL (ref 150.0–400.0)
RBC: 4.91 Mil/uL (ref 4.22–5.81)
WBC: 4.9 10*3/uL (ref 4.5–10.5)

## 2013-02-03 LAB — URINALYSIS, ROUTINE W REFLEX MICROSCOPIC
Leukocytes, UA: NEGATIVE
Nitrite: NEGATIVE
Total Protein, Urine: NEGATIVE
pH: 6 (ref 5.0–8.0)

## 2013-02-03 LAB — BASIC METABOLIC PANEL
BUN: 22 mg/dL (ref 6–23)
CO2: 29 mEq/L (ref 19–32)
Chloride: 106 mEq/L (ref 96–112)
Creatinine, Ser: 1 mg/dL (ref 0.4–1.5)
Glucose, Bld: 110 mg/dL — ABNORMAL HIGH (ref 70–99)
Potassium: 5.2 mEq/L — ABNORMAL HIGH (ref 3.5–5.1)

## 2013-02-03 LAB — HEPATIC FUNCTION PANEL
Alkaline Phosphatase: 60 U/L (ref 39–117)
Bilirubin, Direct: 0.1 mg/dL (ref 0.0–0.3)
Total Bilirubin: 0.8 mg/dL (ref 0.3–1.2)
Total Protein: 7.1 g/dL (ref 6.0–8.3)

## 2013-02-03 LAB — LIPID PANEL
Cholesterol: 142 mg/dL (ref 0–200)
LDL Cholesterol: 71 mg/dL (ref 0–99)
Total CHOL/HDL Ratio: 4
VLDL: 37.4 mg/dL (ref 0.0–40.0)

## 2013-02-03 LAB — PSA: PSA: 1.6 ng/mL (ref 0.10–4.00)

## 2013-02-04 ENCOUNTER — Encounter: Payer: Self-pay | Admitting: Internal Medicine

## 2013-06-10 ENCOUNTER — Other Ambulatory Visit: Payer: Self-pay | Admitting: Internal Medicine

## 2014-03-07 ENCOUNTER — Telehealth: Payer: Self-pay | Admitting: Internal Medicine

## 2014-03-07 NOTE — Telephone Encounter (Signed)
Pt is requesting refills on Flonase and simvastatin (ZOCOR) 40 MG tablet [[id number]] .  He is completely out.  He has an appt in [place].  He has been receiving them through mail order

## 2014-03-08 MED ORDER — SIMVASTATIN 40 MG PO TABS: 40.0000 mg | ORAL_TABLET | Freq: Every day | ORAL | Status: DC

## 2014-03-08 MED ORDER — FLUTICASONE PROPIONATE 50 MCG/ACT NA SUSP: NASAL | Status: DC

## 2014-03-08 NOTE — Telephone Encounter (Signed)
Called pt no answer LMOM [facility], also [facility].../lmb

## 2014-05-25 ENCOUNTER — Other Ambulatory Visit (INDEPENDENT_AMBULATORY_CARE_PROVIDER_SITE_OTHER): Payer: BLUE CROSS/BLUE SHIELD

## 2014-05-25 ENCOUNTER — Encounter: Payer: Self-pay | Admitting: Internal Medicine

## 2014-05-25 ENCOUNTER — Ambulatory Visit (INDEPENDENT_AMBULATORY_CARE_PROVIDER_SITE_OTHER): Payer: BLUE CROSS/BLUE SHIELD | Admitting: Internal Medicine

## 2014-05-25 VITALS — BP 120/86 | HR 64 | Temp 98.2°F | Ht 72.0 in | Wt 185.2 lb

## 2014-05-25 DIAGNOSIS — Z0000 Encounter for general adult medical examination without abnormal findings: Secondary | ICD-10-CM

## 2014-05-25 DIAGNOSIS — Z23 Encounter for immunization: Secondary | ICD-10-CM

## 2014-05-25 LAB — HEPATIC FUNCTION PANEL
ALK PHOS: 65 U/L (ref 39–117)
ALT: 21 U/L (ref 0–53)
AST: 23 U/L (ref 0–37)
Albumin: 4.5 g/dL (ref 3.5–5.2)
Bilirubin, Direct: 0.2 mg/dL (ref 0.0–0.3)
TOTAL PROTEIN: 7.2 g/dL (ref 6.0–8.3)
Total Bilirubin: 0.8 mg/dL (ref 0.2–1.2)

## 2014-05-25 LAB — BASIC METABOLIC PANEL
BUN: 23 mg/dL (ref 6–23)
CO2: 29 mEq/L (ref 19–32)
Calcium: 9.8 mg/dL (ref 8.4–10.5)
Chloride: 106 mEq/L (ref 96–112)
Creatinine, Ser: 1.08 mg/dL (ref 0.40–1.50)
GFR: 73.48 mL/min (ref >60.00)
Glucose, Bld: 99 mg/dL (ref 70–99)
Potassium: 5.3 mEq/L — ABNORMAL HIGH (ref 3.5–5.1)
SODIUM: 139 meq/L (ref 135–145)

## 2014-05-25 LAB — URINALYSIS, ROUTINE W REFLEX MICROSCOPIC
Bilirubin Urine: NEGATIVE
HGB URINE DIPSTICK: NEGATIVE
Ketones, ur: NEGATIVE
LEUKOCYTES UA: NEGATIVE
Nitrite: NEGATIVE
RBC / HPF: NONE SEEN (ref 0–?)
Specific Gravity, Urine: 1.025 (ref 1.000–1.030)
TOTAL PROTEIN, URINE-UPE24: NEGATIVE
URINE GLUCOSE: NEGATIVE
Urobilinogen, UA: 0.2 (ref 0.0–1.0)
pH: 5.5 (ref 5.0–8.0)

## 2014-05-25 LAB — CBC WITH DIFFERENTIAL/PLATELET
Basophils Absolute: 0 10*3/uL (ref 0.0–0.1)
Basophils Relative: 0.5 % (ref 0.0–3.0)
Eosinophils Absolute: 0.1 10*3/uL (ref 0.0–0.7)
Eosinophils Relative: 0.8 % (ref 0.0–5.0)
HEMATOCRIT: 46.6 % (ref 39.0–52.0)
Hemoglobin: 16.5 g/dL (ref 13.0–17.0)
LYMPHS ABS: 1.8 10*3/uL (ref 0.7–4.0)
Lymphocytes Relative: 25.8 % (ref 12.0–46.0)
MCHC: 35.3 g/dL (ref 30.0–36.0)
MCV: 89.3 fl (ref 78.0–100.0)
MONO ABS: 0.4 10*3/uL (ref 0.1–1.0)
MONOS PCT: 6 % (ref 3.0–12.0)
NEUTROS PCT: 66.9 % (ref 43.0–77.0)
Neutro Abs: 4.8 10*3/uL (ref 1.4–7.7)
Platelets: 181 10*3/uL (ref 150.0–400.0)
RBC: 5.22 Mil/uL (ref 4.22–5.81)
RDW: 12 % (ref 11.5–15.5)
WBC: 7.2 10*3/uL (ref 4.0–10.5)

## 2014-05-25 LAB — LIPID PANEL
CHOLESTEROL: 135 mg/dL (ref 0–200)
HDL: 36.2 mg/dL — AB (ref >39.00)
LDL CALC: 78 mg/dL (ref 0–99)
NONHDL: 98.8
Total CHOL/HDL Ratio: 4
Triglycerides: 103 mg/dL (ref 0.0–149.0)
VLDL: 20.6 mg/dL (ref 0.0–40.0)

## 2014-05-25 LAB — TSH: TSH: 1.27 u[IU]/mL (ref 0.35–4.50)

## 2014-05-25 LAB — PSA: PSA: 1.6 ng/mL (ref 0.10–4.00)

## 2014-05-25 MED ORDER — SIMVASTATIN 40 MG PO TABS: 40.0000 mg | ORAL_TABLET | Freq: Every day | ORAL | Status: DC

## 2014-05-25 MED ORDER — FLUTICASONE PROPIONATE 50 MCG/ACT NA SUSP: NASAL | Status: DC

## 2014-05-25 NOTE — Assessment & Plan Note (Signed)

## 2014-05-25 NOTE — Progress Notes (Signed)
Subjective:    Patient ID: Danny Young, male    DOB: 05/09/1950, 64 y.o.   MRN: [id number]  HPI  Here for wellness and f/u;  Overall doing ok;  Pt denies CP, worsening SOB, DOE, wheezing, orthopnea, PND, worsening LE edema, palpitations, dizziness or syncope.  Pt denies neurological change such as new headache, facial or extremity weakness.  Pt denies polydipsia, polyuria, or low sugar symptoms. Pt states overall good compliance with treatment and medications, good tolerability, and has been trying to follow lower cholesterol diet.  Pt denies worsening depressive symptoms, suicidal ideation or panic. No fever, night sweats, wt loss, loss of appetite, or other constitutional symptoms.  Pt states good ability with ADL's, has low fall risk, home safety reviewed and adequate, no other significant changes in hearing or vision, and only occasionally active with exercise. Received notice in the mail about due for colonosopy f/u.  No current compaliints Past Medical History  Diagnosis Date  . 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 HYPERLIPIDEMIA 01/19/2007    Qualifier: Diagnosis of  By: Jenny Reichmann MD, Hunt Oris   . GERD 01/19/2007    Qualifier: Diagnosis of  By: Jenny Reichmann MD, Hunt Oris   . ALLERGIC RHINITIS 01/19/2007    Qualifier: Diagnosis of  By: Jenny Reichmann MD, Hunt Oris   . MIGRAINE HEADACHE 01/19/2007    Qualifier: Diagnosis of  By: Jenny Reichmann MD, Hunt Oris   . 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 Rotater cuff      left    reports that he has never smoked. He has never used smokeless tobacco. He reports that he drinks alcohol. He reports that he does not use illicit drugs. family history includes Hypertension in his father and mother. No Known Allergies Current Outpatient Prescriptions on File Prior to Visit  Medication Sig Dispense Refill  . cetirizine (ZYRTEC) 10 MG tablet Take 1 tablet (10 mg total) by  mouth daily. 90 tablet 3   No current facility-administered medications on file prior to visit.    Review of Systems Constitutional: Negative for increased diaphoresis, other activity, appetite or other siginficant weight change  HENT: Negative for worsening hearing loss, ear pain, facial swelling, mouth sores and neck stiffness.   Eyes: Negative for other worsening pain, redness or visual disturbance.  Respiratory: Negative for shortness of breath and wheezing.   Cardiovascular: Negative for chest pain and palpitations.  Gastrointestinal: Negative for diarrhea, blood in stool, abdominal distention or other pain Genitourinary: Negative for hematuria, flank pain or change in urine volume.  Musculoskeletal: Negative for myalgias or other joint complaints.  Skin: Negative for color change and wound.  Neurological: Negative for syncope and numbness. other than noted Hematological: Negative for adenopathy. or other swelling Psychiatric/Behavioral: Negative for hallucinations, self-injury, decreased concentration or other worsening agitation.      Objective:   Physical Exam BP 120/86 mmHg  Pulse 64  Temp(Src) 98.2 F (36.8 C) (Oral)  Ht 6' (1.829 m)  Wt 185 lb 4 oz (84.029 kg)  BMI 25.12 kg/m2  SpO2 97% VS noted,  Constitutional: Pt is oriented to person, place, and time. Appears well-developed and well-nourished.  Head: Normocephalic and atraumatic.  Right Ear: External ear normal.  Left Ear: External ear normal.  Nose: Nose normal.  Mouth/Throat: Oropharynx is clear and moist.  Eyes: Conjunctivae and EOM are normal. Pupils are equal, round, and reactive to light.  Neck: Normal range of motion. Neck supple. No JVD present. No tracheal deviation present.  Cardiovascular: Normal rate, regular rhythm, normal heart sounds and intact distal pulses.   Pulmonary/Chest: Effort normal and breath sounds without rales or wheezing  Abdominal: Soft. Bowel sounds are normal. NT. No HSM    Musculoskeletal: Normal range of motion. Exhibits no edema.  Lymphadenopathy:  Has no cervical adenopathy.  Neurological: Pt is alert and oriented to person, place, and time. Pt has normal reflexes. No cranial nerve deficit. Motor grossly intact Skin: Skin is warm and dry. No rash noted.  Psychiatric:  Has normal mood and affect. Behavior is normal.         Assessment & Plan:

## 2014-05-25 NOTE — Patient Instructions (Addendum)
Please continue all other medications as before, and refills have been done if requested.  Please have the pharmacy call with any other refills you may need.  Please continue your efforts at being more active, low cholesterol diet, and weight control.  You are otherwise up to date with prevention measures today.  Please keep your appointments with your specialists as you may have planned  Please remember to call for your follow up colonoscopy  Please go to the LAB in the Basement (turn left off the elevator) for the tests to be done today  You will be contacted by phone if any changes need to be made immediately.  Otherwise, you will receive a letter about your results with an explanation, but please check with MyChart first.  Please remember to sign up for MyChart if you have not done so, as this will be important to you in the future with finding out test results, communicating by private email, and scheduling acute appointments online when needed.  Please return in 1 year for your yearly visit, or sooner if needed, with Lab testing done 3-5 days before

## 2015-05-30 ENCOUNTER — Encounter: Payer: BLUE CROSS/BLUE SHIELD | Admitting: Internal Medicine

## 2015-05-31 ENCOUNTER — Encounter: Payer: Self-pay | Admitting: Nurse Practitioner

## 2015-05-31 ENCOUNTER — Ambulatory Visit (INDEPENDENT_AMBULATORY_CARE_PROVIDER_SITE_OTHER): Payer: BLUE CROSS/BLUE SHIELD | Admitting: Nurse Practitioner

## 2015-05-31 VITALS — BP 120/68 | HR 78 | Temp 97.6°F | Ht 72.0 in | Wt 184.8 lb

## 2015-05-31 DIAGNOSIS — J069 Acute upper respiratory infection, unspecified: Secondary | ICD-10-CM | POA: Diagnosis not present

## 2015-05-31 MED ORDER — METHYLPREDNISOLONE 4 MG PO TABS: ORAL_TABLET | ORAL | Status: DC

## 2015-05-31 NOTE — Patient Instructions (Signed)
Prednisone with breakfast or lunch at the latest.  6 tablets on day 1, 5 tablets on day 2, 4 tablets on day 3, 3 tablets on day 4, 2 tablets day 5, 1 tablet on day 6...done! Take tablets all together not spaced out Don't take with NSAIDs (Ibuprofen, Aleve, Naproxen, Meloxicam ect...)

## 2015-05-31 NOTE — Progress Notes (Signed)
Patient ID: Danny Young, male    DOB: 11-09-1950  Age: 65 y.o. MRN: [id number]  CC: Sinusitis and Fever   HPI Danny Young presents for sinusitis and fever x 2 weeks.   1) Was seen on 05/22/15 by Dr. Lujean Young at [facility]   Diagnosis- right eye conjunctivitis, sinusitis  Treated with Augmentin, Tussionex, and garamycin for eye drops  Today: Maxillary fullness bilaterally, 102 temp last night   Coricidin HBP- stopped over the weekend  Advil for temperature and fullness sensation   History Danny Young has a past medical history of Cervical spine degeneration (10/22/2011); HYPERLIPIDEMIA (01/19/2007); GERD (01/19/2007); ALLERGIC RHINITIS (01/19/2007); MIGRAINE HEADACHE (01/19/2007); and WOLFF (WOLFE)-PARKINSON-WHITE (WPW) SYNDROME (01/19/2007).   He has past surgical history that includes rotater cuff.   His family history includes Hypertension in his father and mother.He reports that he has never smoked. He has never used smokeless tobacco. He reports that he drinks alcohol. He reports that he does not use illicit drugs.  Outpatient Prescriptions Prior to Visit  Medication Sig Dispense Refill  . cetirizine (ZYRTEC) 10 MG tablet Take 1 tablet (10 mg total) by mouth daily. 90 tablet 3  . fluticasone (FLONASE) 50 MCG/ACT nasal spray USE 2 SPRAYS NASALLY DAILY (Patient not taking: Reported on 06/03/2015) 48 g 3  . simvastatin (ZOCOR) 40 MG tablet Take 1 tablet (40 mg total) by mouth at bedtime. 90 tablet 3  . aspirin 81 MG tablet Take 81 mg by mouth daily.     No facility-administered medications prior to visit.    ROS Review of Systems  Constitutional: Negative for fever, chills, diaphoresis and fatigue.  HENT: Positive for congestion and sinus pressure. Negative for ear discharge and ear pain.   Eyes: Positive for discharge, redness and itching. Negative for visual disturbance.  Respiratory: Negative for chest tightness, shortness of breath and wheezing.   Cardiovascular: Negative  for chest pain, palpitations and leg swelling.  Gastrointestinal: Negative for nausea, vomiting and diarrhea.  Skin: Negative for rash.  Neurological: Positive for headaches. Negative for dizziness, weakness and numbness.  Psychiatric/Behavioral: The patient is not nervous/anxious.     Objective:  BP 120/68 mmHg  Pulse 78  Temp(Src) 97.6 F (36.4 C) (Oral)  Ht 6' (1.829 m)  Wt 184 lb 12 oz (83.802 kg)  BMI 25.05 kg/m2  SpO2 97%  Physical Exam  Constitutional: He is oriented to person, place, and time. He appears well-developed and well-nourished. No distress.  HENT:  Head: Normocephalic and atraumatic.  Right Ear: External ear normal.  Left Ear: External ear normal.  Eyes: Conjunctivae and EOM are normal. Pupils are equal, round, and reactive to light. Right eye exhibits no discharge. Left eye exhibits no discharge. No scleral icterus.  Conjunctivitis resolved  Neck: Normal range of motion. Neck supple.  Cardiovascular: Normal rate, regular rhythm and normal heart sounds.  Exam reveals no gallop and no friction rub.   No murmur heard. Pulmonary/Chest: Effort normal and breath sounds normal. No respiratory distress. He has no wheezes. He has no rales. He exhibits no tenderness.  Lymphadenopathy:    He has no cervical adenopathy.  Neurological: He is alert and oriented to person, place, and time.  Skin: Skin is warm and dry. No rash noted. He is not diaphoretic.  Psychiatric: He has a normal mood and affect. His behavior is normal. Judgment and thought content normal.   Assessment & Plan:   There are no diagnoses linked to this encounter. I am having Danny Young maintain  his cetirizine, simvastatin, fluticasone, and aspirin.  Meds ordered this encounter  Medications  . amoxicillin-clavulanate (AUGMENTIN) 875-125 MG tablet    Sig: Take by mouth.  . DISCONTD: guaiFENesin-codeine (ROBITUSSIN AC) 100-10 MG/5ML syrup    Sig: Take by mouth.  Marland Kitchen gentamicin (GARAMYCIN) 0.3 %  ophthalmic solution    Sig: Apply to eye.  Marland Kitchen aspirin 81 MG chewable tablet    Sig: Chew by mouth.  . DISCONTD: methylPREDNISolone (MEDROL) 4 MG tablet    Sig: Take 6 tablets by mouth with breakfast or lunch and decrease by 1 tablet each day until gone.    Dispense:  21 tablet    Refill:  0    Order Specific Question:  Supervising Provider    Answer:  Crecencio Mc [[id number]]     Follow-up: Return if symptoms worsen or fail to improve.

## 2015-05-31 NOTE — Progress Notes (Signed)
Pre visit review using our clinic review tool, if applicable. No additional management support is needed unless otherwise documented below in the visit note. 

## 2015-06-02 ENCOUNTER — Telehealth: Payer: Self-pay | Admitting: Internal Medicine

## 2015-06-02 NOTE — Telephone Encounter (Signed)
PLEASE NOTE: All timestamps contained within this report are represented as [place] Standard Time. CONFIDENTIALTY NOTICE: This fax transmission is intended only for the addressee. It contains information that is legally privileged, confidential or otherwise protected from use or disclosure. If you are not the intended recipient, you are strictly prohibited from reviewing, disclosing, copying using or disseminating any of this information or taking any action in reliance on or regarding this information. If you have received this fax in error, please notify us immediately by telephone so that we can arrange for its return to [place]. Phone: [phone], Toll-Free: [phone], Fax: [phone] Page: 1 of 2 Call Id: [id number] [facility] [place] Patient Name: Danny Young Gender: Male DOB: 06/22/1950 Age: 65 Y 16 M 5 D Return Phone Number: [phone] (Primary), [phone] (Secondary) Address: City/State/Zip: [place] [address] Client [facility] - Client Client Site [place] - Day Physician [place], [place] Type Call Call Type Triage / Clinical Relationship To Patient Self Appointment Disposition EMR Appointment Not Necessary Info pasted into Epic Yes Return Phone Number [phone] (Primary) Chief Complaint Fever (non urgent symptom) (> THREE MONTHS) Initial Comment Caller states, fever since yesterday, 100.2 today Nurse Assessment Nurse: Mechele Dawley, RN, Amy Date/Time (Eastern Time): 06/02/2015 8:45:28 AM Confirm and document reason for call. If symptomatic, describe symptoms. You must click the next button to save text entered. ---HE STATES THAT IT STARTED OUT WITH PINK EYE X 10 DAYS. X 10 DAYS. HE HAS BEEN HAVING FEVER FOR 3 DAYS. HE STATES THAT SHE HAS BEEN OFF AND ON WITH FEVER X 3 DAYS NOW. HE STATES FEVER FOR 3 DAYS. HE HAS BEEN ON PREDNISONE DOSE  PACK. Has the patient traveled out of the country within the last 30 days? ---Not Applicable Does the patient have any new or worsening symptoms? ---Yes Will a triage be completed? ---Yes Related visit to physician within the last 2 weeks? ---No Does the PT have any chronic conditions? (i.e. diabetes, asthma, etc.) ---No Is this a behavioral health or substance abuse call? ---No Guidelines Guideline Title Affirmed Question Affirmed Notes Nurse Date/Time ([place] Time) Disp. Time Eilene Ghazi Time) Disposition Final User 06/02/2015 9:19:35 AM Clinical Call Yes Mechele Dawley, RN, Amy After Care Instructions Given Call Event Type User Date / Time Description Comments User: Susanne Borders, RN Date/Time Eilene Ghazi Time): 06/02/2015 9:19:21 AM PLEASE NOTE: All timestamps contained within this report are represented as [place] Standard Time. CONFIDENTIALTY NOTICE: This fax transmission is intended only for the addressee. It contains information that is legally privileged, confidential or otherwise protected from use or disclosure. If you are not the intended recipient, you are strictly prohibited from reviewing, disclosing, copying using or disseminating any of this information or taking any action in reliance on or regarding this information. If you have received this fax in error, please notify us immediately by telephone so that we can arrange for its return to [place]. Phone: [phone], Toll-Free: [phone], Fax: [phone] Page: 2 of 2 Call Id: [id number] Comments CALLER STATES THAT HE HAS BEEN SICK FOR THE PAST COUPLE OF WEEKS. HE STATES THAT HIM AND HIS WIFE HAD PINK EYE. HE WENT TO THE MD YESTERDAY AND WAS GIVEN SOME PREDNISONE AND HE HAS BEEN HAVING FEVER FOR THE PAST COUPLE OF DAYS. HE STATES HE DOES NOT KNOW WHAT TO TAKE FOR THE FEVER. INSTRUCTED HIM THAT HE CAN TAKE SOME TYLENOL FOR THE FEVER IF HE WANTS  TO DO SO. HE STATES THAT HE WILL TAKE THE TYLENOL. HE STATES THAT HE WILL TAKE THE TYLENOL AND  THAT IF HE HAS ANY WORSENING IN SYMPTOMS WILL CALL BACK TODAY FOR AN APPT.

## 2015-06-03 ENCOUNTER — Ambulatory Visit (INDEPENDENT_AMBULATORY_CARE_PROVIDER_SITE_OTHER): Payer: BLUE CROSS/BLUE SHIELD | Admitting: Internal Medicine

## 2015-06-03 ENCOUNTER — Ambulatory Visit (HOSPITAL_COMMUNITY)
Admission: RE | Admit: 2015-06-03 | Discharge: 2015-06-03 | Disposition: A | Discharge status: Home / Self Care | Payer: BLUE CROSS/BLUE SHIELD | Source: Ambulatory Visit | Attending: Internal Medicine | Admitting: Internal Medicine

## 2015-06-03 ENCOUNTER — Other Ambulatory Visit (HOSPITAL_COMMUNITY)
Admission: RE | Admit: 2015-06-03 | Discharge: 2015-06-03 | Disposition: A | Discharge status: Home / Self Care | Payer: BLUE CROSS/BLUE SHIELD | Source: Other Acute Inpatient Hospital | Attending: Internal Medicine | Admitting: Internal Medicine

## 2015-06-03 ENCOUNTER — Encounter: Payer: Self-pay | Admitting: Internal Medicine

## 2015-06-03 VITALS — BP 140/82 | HR 81 | Temp 98.4°F | Ht 72.0 in | Wt 182.0 lb

## 2015-06-03 DIAGNOSIS — R509 Fever, unspecified: Secondary | ICD-10-CM | POA: Diagnosis not present

## 2015-06-03 DIAGNOSIS — R6889 Other general symptoms and signs: Secondary | ICD-10-CM | POA: Diagnosis not present

## 2015-06-03 LAB — CBC WITH DIFFERENTIAL/PLATELET
BASOS ABS: 0 10*3/uL (ref 0.0–0.1)
BASOS PCT: 0 %
Eosinophils Absolute: 0.4 10*3/uL (ref 0.0–0.7)
Eosinophils Relative: 4 %
HEMATOCRIT: 44.9 % (ref 39.0–52.0)
HEMOGLOBIN: 15.4 g/dL (ref 13.0–17.0)
LYMPHS ABS: 1.4 10*3/uL (ref 0.7–4.0)
LYMPHS PCT: 15 %
MCH: 31.8 pg (ref 26.0–34.0)
MCHC: 34.3 g/dL (ref 30.0–36.0)
MCV: 92.8 fL (ref 78.0–100.0)
Monocytes Absolute: 0.3 10*3/uL (ref 0.1–1.0)
Monocytes Relative: 4 %
Neutro Abs: 7.2 10*3/uL (ref 1.7–7.7)
Neutrophils Relative %: 77 %
Platelets: 260 10*3/uL (ref 150–400)
RBC: 4.84 MIL/uL (ref 4.22–5.81)
RDW: 11.7 % (ref 11.5–15.5)
WBC: 9.3 10*3/uL (ref 4.0–10.5)

## 2015-06-03 LAB — URINALYSIS, DIPSTICK ONLY
BILIRUBIN URINE: NEGATIVE
Glucose, UA: NEGATIVE mg/dL
HGB URINE DIPSTICK: NEGATIVE
Ketones, ur: NEGATIVE mg/dL
Leukocytes, UA: NEGATIVE
Nitrite: NEGATIVE
Protein, ur: NEGATIVE mg/dL
SPECIFIC GRAVITY, URINE: 1.028 (ref 1.005–1.030)
pH: 6 (ref 5.0–8.0)

## 2015-06-03 LAB — COMPREHENSIVE METABOLIC PANEL
ALBUMIN: 4.3 g/dL (ref 3.5–5.0)
ALT: 24 U/L (ref 17–63)
AST: 17 U/L (ref 15–41)
Alkaline Phosphatase: 62 U/L (ref 38–126)
Anion gap: 11 (ref 5–15)
BILIRUBIN TOTAL: 1.2 mg/dL (ref 0.3–1.2)
BUN: 31 mg/dL — AB (ref 6–20)
CO2: 28 mmol/L (ref 22–32)
Calcium: 9.4 mg/dL (ref 8.9–10.3)
Chloride: 100 mmol/L — ABNORMAL LOW (ref 101–111)
Creatinine, Ser: 1.29 mg/dL — ABNORMAL HIGH (ref 0.61–1.24)
GFR calc Af Amer: >60 mL/min (ref >60)
GFR calc non Af Amer: 57 mL/min — ABNORMAL LOW (ref >60)
GLUCOSE: 122 mg/dL — AB (ref 65–99)
POTASSIUM: 4.3 mmol/L (ref 3.5–5.1)
Sodium: 139 mmol/L (ref 135–145)
TOTAL PROTEIN: 7.6 g/dL (ref 6.5–8.1)

## 2015-06-03 LAB — POCT INFLUENZA A/B
Influenza A, POC: NEGATIVE
Influenza B, POC: NEGATIVE

## 2015-06-03 MED ORDER — MOXIFLOXACIN HCL 400 MG PO TABS: 400.0000 mg | ORAL_TABLET | Freq: Every day | ORAL | Status: DC

## 2015-06-03 NOTE — Progress Notes (Signed)
Pre visit review using our clinic review tool, if applicable. No additional management support is needed unless otherwise documented below in the visit note. 

## 2015-06-03 NOTE — Patient Instructions (Signed)
Please go across the street to get your x-ray and blood work.  We'll call you with the results  If you have high fever, severe headache, a rash, please call or go to the ER.   Follow-up will your primary doctor next week.

## 2015-06-03 NOTE — Progress Notes (Signed)
Subjective:    Patient ID: Danny Young, male    DOB: 1951-02-25, 65 y.o.   MRN: [id number]  DOS:  06/03/2015 Type of visit - description : acute Interval history:  Seen 05/22/2015 elsewhere, had a one-week history of sinus congestion, sore throat, productive cough, eye  irritation ; dx URI, Rx Augmentin, codeine and  Garamycin eyedrops. Was seen again 05/31/2015 here, not reviewed (no physical exam documented) , was told to initiate ABX and and prescribed prednisone.  He is here because his not feeling well. He is actually continue running fever since the onset of symptoms up to 102. Last night was around 100. Continue with generalized aches, malaise, feeling sleepy all day, global and frontal headache. Appetite is somewhat decreased.    Review of Systems  Denies nausea vomiting Had diarrhea couple of days ago, otherwise no diarrhea No chest pain or difficulty breathing Mild cough throughout his illness, minimal if any sputum production. Mild nasal discharge, worse with the onset of symptoms, now better No dysuria or gross hematuria  Past Medical History  Diagnosis Date  . 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 HYPERLIPIDEMIA 01/19/2007    Qualifier: Diagnosis of  By: Jenny Reichmann MD, Hunt Oris   . GERD 01/19/2007    Qualifier: Diagnosis of  By: Jenny Reichmann MD, Hunt Oris   . ALLERGIC RHINITIS 01/19/2007    Qualifier: Diagnosis of  By: Jenny Reichmann MD, Hunt Oris   . MIGRAINE HEADACHE 01/19/2007    Qualifier: Diagnosis of  By: Jenny Reichmann MD, Hunt Oris   . 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 Rotater cuff      left    Social History   Social History  . Marital Status: Married    Spouse Name: N/A  . Number of Children: N/A  . Years of Education: N/A   Occupational History  . Not on file.   Social History Main Topics  . Smoking status: Never Smoker   . Smokeless tobacco: Never Used  .  Alcohol Use: Yes     Comment: very occasional wine  . Drug Use: No  . Sexual Activity: Not on file   Other Topics Concern  . Not on file   Social History Narrative        Medication List       This list is accurate as of: 06/03/15 11:59 PM.  Always use your most recent med list.               aspirin 81 MG chewable tablet  Chew by mouth.     cetirizine 10 MG tablet  Commonly known as:  ZYRTEC  Take 1 tablet (10 mg total) by mouth daily.     fluticasone 50 MCG/ACT nasal spray  Commonly known as:  FLONASE  USE 2 SPRAYS NASALLY DAILY     moxifloxacin 400 MG tablet  Commonly known as:  AVELOX  Take 1 tablet (400 mg total) by mouth daily.     simvastatin 40 MG tablet  Commonly known as:  ZOCOR  Take 1 tablet (40 mg total) by mouth at bedtime.           Objective:   Physical Exam BP 140/82 mmHg  Pulse 81  Temp(Src) 98.4 F (36.9 C) (Oral)  Ht 6' (1.829 m)  Wt 182 lb (82.555 kg)  BMI 24.68 kg/m2  SpO2 96% General:   Well  developed, well nourished . NAD, nontoxic appearing.  HEENT:  Normocephalic . Face symmetric, atraumatic. TMs normal. Throat  symmetric, no red. Nose is slightly congested, right maxillary area slightly TTP. Neck: Supple Lungs:  CTA B Normal respiratory effort, no intercostal retractions, no accessory muscle use. Heart: RRR,  no murmur.  no pretibial edema bilaterally  Abdomen:  Not distended, soft, non-tender. No rebound or rigidity.   Skin: Not pale. Not jaundice Neurologic:  alert & oriented X3.  Speech normal, gait appropriate for age and unassisted Psych--  Cognition and judgment appear intact.  Cooperative with normal attention span and concentration.  Behavior appropriate. No anxious or depressed appearing.    Assessment & Plan:   Viral syndrome, fever:  Symptoms started almost 3 weeks ago, having malaise, mild cough, frontal/global headache, aches. Status post Augmentin, now on prednisone. The fact that he continue to  be febrile means that either this is a protracted virus or has not responded to the antibiotic. Other etiologies may need to be rule out if no better soon plan:  Chest x-ray, UA, urine culture, CBC (on steroids) , CMP. We'll call the patient with results later. If workup negative, consider a 2nd round of abx  for possible sinusitis and dc pred Addendum: UA negative, chest x-ray negative, CBC and CMP normal. I spoke with the patient about results, sinusitis? Will prescribe Avelox, discontinue prednisone as it hasn't help his sx, Flonase, Mucinex, see PCP next week as recommended;  patient in agreement. Will notify pcp

## 2015-06-04 DIAGNOSIS — J069 Acute upper respiratory infection, unspecified: Secondary | ICD-10-CM | POA: Insufficient documentation

## 2015-06-04 NOTE — Assessment & Plan Note (Addendum)
New problem to me Will treat conservatively due to probable viral nature Prednisone taper to decrease inflammation given  Instructions for taking done verbally and on AVS FU prn worsening/failure to improve.

## 2015-06-05 ENCOUNTER — Other Ambulatory Visit (INDEPENDENT_AMBULATORY_CARE_PROVIDER_SITE_OTHER): Payer: BLUE CROSS/BLUE SHIELD

## 2015-06-05 DIAGNOSIS — Z0000 Encounter for general adult medical examination without abnormal findings: Secondary | ICD-10-CM

## 2015-06-05 LAB — BASIC METABOLIC PANEL
BUN: 30 mg/dL — AB (ref 6–23)
CHLORIDE: 101 meq/L (ref 96–112)
CO2: 27 mEq/L (ref 19–32)
CREATININE: 1.33 mg/dL (ref 0.40–1.50)
Calcium: 9.5 mg/dL (ref 8.4–10.5)
GFR: 57.59 mL/min — AB (ref >60.00)
Glucose, Bld: 105 mg/dL — ABNORMAL HIGH (ref 70–99)
Potassium: 5 mEq/L (ref 3.5–5.1)
Sodium: 138 mEq/L (ref 135–145)

## 2015-06-05 LAB — TSH: TSH: 0.75 u[IU]/mL (ref 0.35–4.50)

## 2015-06-05 LAB — URINALYSIS, ROUTINE W REFLEX MICROSCOPIC
Bilirubin Urine: NEGATIVE
Hgb urine dipstick: NEGATIVE
KETONES UR: NEGATIVE
LEUKOCYTES UA: NEGATIVE
NITRITE: NEGATIVE
PH: 6 (ref 5.0–8.0)
RBC / HPF: NONE SEEN (ref 0–?)
Total Protein, Urine: NEGATIVE
UROBILINOGEN UA: 0.2 (ref 0.0–1.0)
Urine Glucose: NEGATIVE

## 2015-06-05 LAB — LIPID PANEL
Cholesterol: 145 mg/dL (ref 0–200)
HDL: 27.1 mg/dL — AB (ref >39.00)
LDL CALC: 85 mg/dL (ref 0–99)
NonHDL: 117.91
TRIGLYCERIDES: 165 mg/dL — AB (ref 0.0–149.0)
Total CHOL/HDL Ratio: 5
VLDL: 33 mg/dL (ref 0.0–40.0)

## 2015-06-05 LAB — HEPATIC FUNCTION PANEL
ALT: 22 U/L (ref 0–53)
AST: 14 U/L (ref 0–37)
Albumin: 4.4 g/dL (ref 3.5–5.2)
Alkaline Phosphatase: 64 U/L (ref 39–117)
BILIRUBIN TOTAL: 0.8 mg/dL (ref 0.2–1.2)
Bilirubin, Direct: 0.1 mg/dL (ref 0.0–0.3)
Total Protein: 7.6 g/dL (ref 6.0–8.3)

## 2015-06-05 LAB — CBC WITH DIFFERENTIAL/PLATELET
BASOS ABS: 0 10*3/uL (ref 0.0–0.1)
BASOS PCT: 0.4 % (ref 0.0–3.0)
EOS ABS: 0.2 10*3/uL (ref 0.0–0.7)
Eosinophils Relative: 1.8 % (ref 0.0–5.0)
HEMATOCRIT: 51.5 % (ref 39.0–52.0)
HEMOGLOBIN: 17.3 g/dL — AB (ref 13.0–17.0)
LYMPHS PCT: 16.5 % (ref 12.0–46.0)
Lymphs Abs: 1.7 10*3/uL (ref 0.7–4.0)
MCHC: 33.6 g/dL (ref 30.0–36.0)
MCV: 93.7 fl (ref 78.0–100.0)
Monocytes Absolute: 0.6 10*3/uL (ref 0.1–1.0)
Monocytes Relative: 5.5 % (ref 3.0–12.0)
Neutro Abs: 7.9 10*3/uL — ABNORMAL HIGH (ref 1.4–7.7)
Neutrophils Relative %: 75.8 % (ref 43.0–77.0)
Platelets: 335 10*3/uL (ref 150.0–400.0)
RBC: 5.49 Mil/uL (ref 4.22–5.81)
RDW: 12.2 % (ref 11.5–15.5)
WBC: 10.5 10*3/uL (ref 4.0–10.5)

## 2015-06-05 LAB — URINE CULTURE: Culture: 1000

## 2015-06-05 LAB — PSA: PSA: 1.5 ng/mL (ref 0.10–4.00)

## 2015-06-07 ENCOUNTER — Ambulatory Visit (INDEPENDENT_AMBULATORY_CARE_PROVIDER_SITE_OTHER): Payer: BLUE CROSS/BLUE SHIELD | Admitting: Internal Medicine

## 2015-06-07 ENCOUNTER — Encounter: Payer: Self-pay | Admitting: Internal Medicine

## 2015-06-07 ENCOUNTER — Other Ambulatory Visit (INDEPENDENT_AMBULATORY_CARE_PROVIDER_SITE_OTHER): Payer: BLUE CROSS/BLUE SHIELD

## 2015-06-07 VITALS — BP 140/84 | HR 93 | Temp 98.7°F | Resp 20 | Ht 72.0 in | Wt 178.0 lb

## 2015-06-07 DIAGNOSIS — R509 Fever, unspecified: Secondary | ICD-10-CM

## 2015-06-07 DIAGNOSIS — R251 Tremor, unspecified: Secondary | ICD-10-CM

## 2015-06-07 DIAGNOSIS — Z0000 Encounter for general adult medical examination without abnormal findings: Secondary | ICD-10-CM

## 2015-06-07 DIAGNOSIS — M545 Low back pain, unspecified: Secondary | ICD-10-CM | POA: Insufficient documentation

## 2015-06-07 DIAGNOSIS — Z0001 Encounter for general adult medical examination with abnormal findings: Secondary | ICD-10-CM | POA: Diagnosis not present

## 2015-06-07 LAB — SEDIMENTATION RATE: Sed Rate: 11 mm/hr (ref 0–22)

## 2015-06-07 LAB — C-REACTIVE PROTEIN: CRP: 0.1 mg/dL — AB (ref 0.5–20.0)

## 2015-06-07 MED ORDER — LEVOFLOXACIN 500 MG PO TABS: 500.0000 mg | ORAL_TABLET | Freq: Every day | ORAL | Status: DC

## 2015-06-07 NOTE — Assessment & Plan Note (Addendum)
Etiology unclear, abnormal spine exam but neuro no change except for tremor; for LS spine mri, esr, crp; also for total 3 wks antibx as cant r/o underlying prostatitis as source of fevers

## 2015-06-07 NOTE — Progress Notes (Signed)
Pre visit review using our clinic review tool, if applicable. No additional management support is needed unless otherwise documented below in the visit note. 

## 2015-06-07 NOTE — Patient Instructions (Addendum)
Your EKG was OK today  OK to finish the avelox as you have  Then Please take all new medication as prescribed - the levaquin  Please continue all other medications as before, and refills have been done if requested.  Please have the pharmacy call with any other refills you may need.  Please continue your efforts at being more active, low cholesterol diet, and weight control.  You are otherwise up to date with prevention measures today.  Please keep your appointments with your specialists as you may have planned  You will be contacted regarding the referral for: Neurology, as well Head MRI, and Lower back MRI  Please go to the LAB in the Basement (turn left off the elevator) for the tests to be done today  You will be contacted by phone if any changes need to be made immediately.  Otherwise, you will receive a letter about your results with an explanation, but please check with MyChart first.  Please return in 6 months, or sooner if needed  If fevers persist, the next steps would be to be referred to Infectious Disease

## 2015-06-07 NOTE — Assessment & Plan Note (Addendum)

## 2015-06-07 NOTE — Assessment & Plan Note (Signed)
For eval as above, also total 3 wks antbx in light of back pain cant r/o prostatitis

## 2015-06-07 NOTE — Assessment & Plan Note (Signed)
Onset LUE only with tremors, usually intentional as in eating or holding papers in hand, assoc with fever onset, for MRI head, refer neurology

## 2015-06-07 NOTE — Progress Notes (Addendum)
Subjective:    Patient ID: Danny Young, male    DOB: May 25, 1950, 65 y.o.   MRN: [id number]  HPI  Here for wellness and f/u;  Overall doing ok;  Pt denies Chest pain, worsening SOB, DOE, wheezing, orthopnea, PND, worsening LE edema, palpitations, dizziness or syncope.  Pt denies neurological change such as new headache, facial or extremity weakness.  Pt denies polydipsia, polyuria, or low sugar symptoms. Pt states overall good compliance with treatment and medications, good tolerability, and has been trying to follow appropriate diet.  Pt denies worsening depressive symptoms, suicidal ideation or panic. No night sweats, wt loss, loss of appetite, or other constitutional symptoms.  Pt states good ability with ADL's, has low fall risk, home safety reviewed and adequate, no other significant changes in hearing or vision, and only occasionally active with exercise.  Also with fever x 3 wks, undocumented at provider visits, did take tylenol prior to visit at 145PM, but documented 100.4 to 102 intermttent for 4 wks.  Started with an episode pink eye, seen at [facility] clinic, tx with amoxil, eye drops.  Gets some shakes and tremors when trying to eat with left arm only.  Seemed to start after saw a PA and tx with prednisone course.  Most recently saw Dr Larose Kells last sat (4 days ago), tx with avelox 400 qd x 7 days  for URI, also with cxr and labs that day.  Overall feels some improved with stamina and less need to rest most days, has some improved sinus pain since then, and less overall joitn and muscle aches all over with somewhat less fever.Influenza neg. No tick bites.  No recent outside. No pets at home.  Appetite not good, has last several lbs. Wt Readings from Last 3 Encounters:  06/07/15 178 lb (80.74 kg)  06/03/15 182 lb (82.555 kg)  05/31/15 184 lb 12 oz (83.802 kg)   Lab Results  Component Value Date   WBC 10.5 06/05/2015   HGB 17.3* 06/05/2015   HCT 51.5 06/05/2015   PLT 335.0 06/05/2015   GLUCOSE 105* 06/05/2015   CHOL 145 06/05/2015   TRIG 165.0* 06/05/2015   HDL 27.10* 06/05/2015   LDLDIRECT 84.1 01/06/2007   LDLCALC 85 06/05/2015   ALT 22 06/05/2015   AST 14 06/05/2015   NA 138 06/05/2015   K 5.0 06/05/2015   CL 101 06/05/2015   CREATININE 1.33 06/05/2015   BUN 30* 06/05/2015   CO2 27 06/05/2015   TSH 0.75 06/05/2015   PSA 1.50 06/05/2015   INR 1.0 ratio 07/13/2009   CLINICAL DATA: Patient with fever for 3 weeks. Cold like symptoms.  EXAM: CHEST 2 VIEW  COMPARISON: None.  FINDINGS: Normal cardiac and mediastinal contours. No consolidative pulmonary opacities. No pleural effusion or pneumothorax. Regional skeleton is unremarkable.  IMPRESSION: No acute cardiopulmonary process   Electronically Signed  By: Lovey Newcomer M.D.  On: 06/03/2015 11:04    Past Medical History  Diagnosis Date  . 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 HYPERLIPIDEMIA 01/19/2007    Qualifier: Diagnosis of  By: Jenny Reichmann MD, Hunt Oris   . GERD 01/19/2007    Qualifier: Diagnosis of  By: Jenny Reichmann MD, Hunt Oris   . ALLERGIC RHINITIS 01/19/2007    Qualifier: Diagnosis of  By: Jenny Reichmann MD, Hunt Oris   . 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 Rotater cuff      left    reports that he has never smoked. He has never used smokeless tobacco. He reports that he drinks alcohol. He reports that he does not use illicit drugs. family history includes Hypertension in his father and mother. No Known Allergies Current Outpatient Prescriptions on File Prior to Visit  Medication Sig Dispense Refill  . aspirin 81 MG chewable tablet Chew by mouth.    . cetirizine (ZYRTEC) 10 MG tablet Take 1 tablet (10 mg total) by mouth daily. 90 tablet 3  . fluticasone (FLONASE) 50 MCG/ACT nasal spray USE 2 SPRAYS NASALLY  DAILY 48 g 3  . moxifloxacin (AVELOX) 400 MG tablet Take 1 tablet (400 mg total) by mouth daily. 7 tablet 0  . simvastatin (ZOCOR) 40 MG tablet Take 1 tablet (40 mg total) by mouth at bedtime. 90 tablet 3   No current facility-administered medications on file prior to visit.      Review of Systems  Constitutional: Negative for increased diaphoresis, other activity, appetite or siginficant weight change other than noted HENT: Negative for worsening hearing loss, ear pain, facial swelling, mouth sores and neck stiffness.   Eyes: Negative for other worsening pain, redness or visual disturbance.  Respiratory: Negative for shortness of breath and wheezing  Cardiovascular: Negative for chest pain and palpitations.  Gastrointestinal: Negative for diarrhea, blood in stool, abdominal distention or other pain Genitourinary: Negative for hematuria, flank pain or change in urine volume.  Musculoskeletal: Negative for myalgias or other joint complaints.  Skin: Negative for color change and wound or drainage.  Neurological: Negative for syncope and numbness. other than noted Hematological: Negative for adenopathy. or other swelling Psychiatric/Behavioral: Negative for hallucinations, SI, self-injury, decreased concentration or other worsening agitation.     Objective:   Physical Exam BP 140/84 mmHg  Pulse 93  Temp(Src) 98.7 F (37.1 C) (Oral)  Resp 20  Ht 6' (1.829 m)  Wt 178 lb (80.74 kg)  BMI 24.14 kg/m2  SpO2 98% VS noted, not ill appaering/non toxic but fatigued and "puny" per wife Constitutional: Pt is oriented to person, place, and time. Appears well-developed and well-nourished, in no significant distress Head: Normocephalic and atraumatic.  Right Ear: External ear normal.  Left Ear: External ear normal.  Nose: Nose normal.  Mouth/Throat: Oropharynx is clear and moist.  Eyes: Conjunctivae and EOM are normal. Pupils are equal, round, and reactive to light.  Neck: Normal range of  motion. Neck supple. No JVD present. No tracheal deviation present or significant neck LA or mass Cardiovascular: Normal rate, regular rhythm, normal heart sounds and intact distal pulses.   Pulmonary/Chest: Effort normal and breath sounds without rales or wheezing  Abdominal: Soft. Bowel sounds are normal. NT. No HSM  Musculoskeletal: Normal range of motion. Exhibits no edema. No joint effusions, has moderate tender mid lumbar midline tender without swelling or rash Lymphadenopathy:  Has no cervical adenopathy.  Neurological: Pt is alert and oriented to person, place, and time. Pt has normal reflexes. No cranial nerve deficit. Motor grossly intact, did exhibit mild tremor with holding paper in left hand to reaad Skin: Skin is warm and dry. No rash noted.  Psychiatric:  Has normal mood and affect. Behavior is normal.  DRE: deferred per pt    Assessment & Plan:

## 2015-06-08 ENCOUNTER — Encounter: Payer: Self-pay | Admitting: Internal Medicine

## 2015-06-08 LAB — HEPATITIS C ANTIBODY: HCV AB: NEGATIVE

## 2015-06-13 ENCOUNTER — Ambulatory Visit: Payer: BLUE CROSS/BLUE SHIELD | Admitting: Neurology

## 2015-06-14 ENCOUNTER — Encounter: Payer: Self-pay | Admitting: Internal Medicine

## 2015-06-14 ENCOUNTER — Ambulatory Visit
Admission: RE | Admit: 2015-06-14 | Discharge: 2015-06-14 | Disposition: A | Discharge status: Home / Self Care | Payer: BLUE CROSS/BLUE SHIELD | Source: Ambulatory Visit | Attending: Internal Medicine | Admitting: Internal Medicine

## 2015-06-14 DIAGNOSIS — R251 Tremor, unspecified: Secondary | ICD-10-CM

## 2015-06-14 DIAGNOSIS — M545 Low back pain, unspecified: Secondary | ICD-10-CM

## 2015-06-14 DIAGNOSIS — R509 Fever, unspecified: Secondary | ICD-10-CM

## 2015-06-21 ENCOUNTER — Telehealth: Payer: Self-pay | Admitting: Internal Medicine

## 2015-06-21 NOTE — Telephone Encounter (Signed)
Patient is requesting call back with MRI results on back.

## 2015-07-14 ENCOUNTER — Other Ambulatory Visit: Payer: Self-pay | Admitting: Internal Medicine

## 2016-08-14 ENCOUNTER — Other Ambulatory Visit: Payer: Self-pay | Admitting: Internal Medicine

## 2016-08-14 NOTE — Telephone Encounter (Signed)
Patient is overdue for a CPE.

## 2016-10-28 DIAGNOSIS — H25813 Combined forms of age-related cataract, bilateral: Secondary | ICD-10-CM | POA: Diagnosis not present

## 2016-10-28 DIAGNOSIS — H524 Presbyopia: Secondary | ICD-10-CM | POA: Diagnosis not present

## 2016-11-26 ENCOUNTER — Encounter: Payer: BLUE CROSS/BLUE SHIELD | Admitting: Internal Medicine

## 2016-12-17 DIAGNOSIS — Z0100 Encounter for examination of eyes and vision without abnormal findings: Secondary | ICD-10-CM | POA: Diagnosis not present

## 2016-12-26 ENCOUNTER — Ambulatory Visit (INDEPENDENT_AMBULATORY_CARE_PROVIDER_SITE_OTHER): Payer: Medicare HMO | Admitting: Internal Medicine

## 2016-12-26 ENCOUNTER — Other Ambulatory Visit (INDEPENDENT_AMBULATORY_CARE_PROVIDER_SITE_OTHER): Payer: Medicare HMO

## 2016-12-26 ENCOUNTER — Encounter: Payer: Self-pay | Admitting: Internal Medicine

## 2016-12-26 VITALS — BP 122/80 | HR 58 | Temp 98.1°F | Ht 72.0 in | Wt 185.0 lb

## 2016-12-26 DIAGNOSIS — R69 Illness, unspecified: Secondary | ICD-10-CM | POA: Diagnosis not present

## 2016-12-26 DIAGNOSIS — R739 Hyperglycemia, unspecified: Secondary | ICD-10-CM

## 2016-12-26 DIAGNOSIS — Z0001 Encounter for general adult medical examination with abnormal findings: Secondary | ICD-10-CM

## 2016-12-26 DIAGNOSIS — Z114 Encounter for screening for human immunodeficiency virus [HIV]: Secondary | ICD-10-CM | POA: Diagnosis not present

## 2016-12-26 DIAGNOSIS — Z23 Encounter for immunization: Secondary | ICD-10-CM | POA: Diagnosis not present

## 2016-12-26 DIAGNOSIS — G4710 Hypersomnia, unspecified: Secondary | ICD-10-CM | POA: Insufficient documentation

## 2016-12-26 DIAGNOSIS — E785 Hyperlipidemia, unspecified: Secondary | ICD-10-CM | POA: Diagnosis not present

## 2016-12-26 DIAGNOSIS — L989 Disorder of the skin and subcutaneous tissue, unspecified: Secondary | ICD-10-CM | POA: Diagnosis not present

## 2016-12-26 LAB — HEPATIC FUNCTION PANEL
ALT: 14 U/L (ref 0–53)
AST: 18 U/L (ref 0–37)
Albumin: 4.8 g/dL (ref 3.5–5.2)
Alkaline Phosphatase: 69 U/L (ref 39–117)
BILIRUBIN DIRECT: 0.1 mg/dL (ref 0.0–0.3)
BILIRUBIN TOTAL: 0.9 mg/dL (ref 0.2–1.2)
Total Protein: 7.5 g/dL (ref 6.0–8.3)

## 2016-12-26 LAB — LIPID PANEL
CHOL/HDL RATIO: 6
Cholesterol: 189 mg/dL (ref 0–200)
HDL: 32.7 mg/dL — ABNORMAL LOW (ref >39.00)
LDL CALC: 119 mg/dL — AB (ref 0–99)
NONHDL: 156.05
TRIGLYCERIDES: 185 mg/dL — AB (ref 0.0–149.0)
VLDL: 37 mg/dL (ref 0.0–40.0)

## 2016-12-26 LAB — CBC WITH DIFFERENTIAL/PLATELET
BASOS ABS: 0 10*3/uL (ref 0.0–0.1)
BASOS PCT: 0.7 % (ref 0.0–3.0)
EOS ABS: 0.1 10*3/uL (ref 0.0–0.7)
EOS PCT: 1.7 % (ref 0.0–5.0)
HCT: 49.4 % (ref 39.0–52.0)
Hemoglobin: 16.9 g/dL (ref 13.0–17.0)
LYMPHS ABS: 2 10*3/uL (ref 0.7–4.0)
LYMPHS PCT: 35.9 % (ref 12.0–46.0)
MCHC: 34.2 g/dL (ref 30.0–36.0)
MCV: 93.1 fl (ref 78.0–100.0)
MONO ABS: 0.4 10*3/uL (ref 0.1–1.0)
Monocytes Relative: 7.7 % (ref 3.0–12.0)
NEUTROS PCT: 54 % (ref 43.0–77.0)
Neutro Abs: 3 10*3/uL (ref 1.4–7.7)
Platelets: 190 10*3/uL (ref 150.0–400.0)
RBC: 5.3 Mil/uL (ref 4.22–5.81)
RDW: 12.4 % (ref 11.5–15.5)
WBC: 5.5 10*3/uL (ref 4.0–10.5)

## 2016-12-26 LAB — URINALYSIS, ROUTINE W REFLEX MICROSCOPIC
BILIRUBIN URINE: NEGATIVE
HGB URINE DIPSTICK: NEGATIVE
Ketones, ur: NEGATIVE
LEUKOCYTES UA: NEGATIVE
Nitrite: NEGATIVE
RBC / HPF: NONE SEEN (ref 0–?)
Specific Gravity, Urine: 1.02 (ref 1.000–1.030)
TOTAL PROTEIN, URINE-UPE24: NEGATIVE
URINE GLUCOSE: NEGATIVE
UROBILINOGEN UA: 0.2 (ref 0.0–1.0)
WBC, UA: NONE SEEN (ref 0–?)
pH: 6 (ref 5.0–8.0)

## 2016-12-26 LAB — BASIC METABOLIC PANEL
BUN: 24 mg/dL — AB (ref 6–23)
CHLORIDE: 103 meq/L (ref 96–112)
CO2: 29 mEq/L (ref 19–32)
CREATININE: 1.16 mg/dL (ref 0.40–1.50)
Calcium: 9.7 mg/dL (ref 8.4–10.5)
GFR: 67.11 mL/min (ref >60.00)
Glucose, Bld: 106 mg/dL — ABNORMAL HIGH (ref 70–99)
POTASSIUM: 4.8 meq/L (ref 3.5–5.1)
Sodium: 139 mEq/L (ref 135–145)

## 2016-12-26 LAB — PSA: PSA: 2.22 ng/mL (ref 0.10–4.00)

## 2016-12-26 LAB — TSH: TSH: 1.15 u[IU]/mL (ref 0.35–4.50)

## 2016-12-26 LAB — HIV ANTIBODY (ROUTINE TESTING W REFLEX): HIV 1&2 Ab, 4th Generation: NONREACTIVE

## 2016-12-26 LAB — HEMOGLOBIN A1C: Hgb A1c MFr Bld: 5.5 % (ref 4.6–6.5)

## 2016-12-26 MED ORDER — SIMVASTATIN 40 MG PO TABS: 40.0000 mg | ORAL_TABLET | Freq: Every day | ORAL | 3 refills | Status: DC

## 2016-12-26 NOTE — Patient Instructions (Addendum)
You had the flu shot today  Please remember to call for your overdue colonoscopy follow up  Please return in 2 weeks for a Nurse Visit appointment for the Prevnar 13  You will be contacted regarding the referral for: Dermatology, as well as Pulmonary for the possible sleep apnea  Please continue all other medications as before, and refills have been done if requested - the zocor  Please have the pharmacy call with any other refills you may need.  Please continue your efforts at being more active, low cholesterol diet, and weight control.  You are otherwise up to date with prevention measures today.  Please keep your appointments with your specialists as you may have planned  Please go to the LAB in the Basement (turn left off the elevator) for the tests to be done today  You will be contacted by phone if any changes need to be made immediately.  Otherwise, you will receive a letter about your results with an explanation, but please check with MyChart first.  Please remember to sign up for MyChart if you have not done so, as this will be important to you in the future with finding out test results, communicating by private email, and scheduling acute appointments online when needed.  Please return in 1 year for your yearly visit, or sooner if needed, with Lab testing done 3-5 days before

## 2016-12-26 NOTE — Progress Notes (Signed)
Subjective:    Patient ID: Danny Young, male    DOB: 05-24-1950, 66 y.o.   MRN: [id number]  HPI  Here for wellness and f/u;  Overall doing ok;  Pt denies Chest pain, worsening SOB, DOE, wheezing, orthopnea, PND, worsening LE edema, palpitations, dizziness or syncope.  Pt denies neurological change such as new headache, facial or extremity weakness.  Pt denies polydipsia, polyuria, or low sugar symptoms. Pt states overall good compliance with treatment and medications, good tolerability, and has been trying to follow appropriate diet.  Pt denies worsening depressive symptoms, suicidal ideation or panic. No fever, night sweats, wt loss, loss of appetite, or other constitutional symptoms.  Pt states good ability with ADL's, has low fall risk, home safety reviewed and adequate, no other significant changes in hearing or vision, and only occasionally active with exercise.  Has beenout of zocor for 3 months, trying to work on lower chol diet  Also is  snoring badly and seems to stop breathing per wife.  Tends to sleep 'standing up" sometimes, but not with driving.   Considering retirement next year.  Pt continues to have recurring LBP without change in severity, bowel or bladder change, fever, wt loss,  worsening LE pain/numbness/weakness, gait change or falls. Past Medical History:  Diagnosis Date  . ALLERGIC RHINITIS 01/19/2007   Qualifier: Diagnosis of  By: Jenny Reichmann MD, Hunt Oris   . 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 GERD 01/19/2007   Qualifier: Diagnosis of  By: Jenny Reichmann MD, Hunt Oris   . HYPERLIPIDEMIA 01/19/2007   Qualifier: Diagnosis of  By: Jenny Reichmann MD, Hunt Oris   . MIGRAINE HEADACHE 01/19/2007   Qualifier: Diagnosis of  By: Jenny Reichmann MD, Hunt Oris   . 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 rotater cuff     left    reports that he has never smoked. He has never used smokeless tobacco. He reports  that he drinks alcohol. He reports that he does not use drugs. family history includes Hypertension in his father and mother. No Known Allergies Current Outpatient Prescriptions on File Prior to Visit  Medication Sig Dispense Refill  . aspirin 81 MG chewable tablet Chew by mouth.     No current facility-administered medications on file prior to visit.    Review of Systems Constitutional: Negative for other unusual diaphoresis, sweats, appetite or weight changes HENT: Negative for other worsening hearing loss, ear pain, facial swelling, mouth sores or neck stiffness.   Eyes: Negative for other worsening pain, redness or other visual disturbance.  Respiratory: Negative for other stridor or swelling Cardiovascular: Negative for other palpitations or other chest pain  Gastrointestinal: Negative for worsening diarrhea or loose stools, blood in stool, distention or other pain Genitourinary: Negative for hematuria, flank pain or other change in urine volume.  Musculoskeletal: Negative for myalgias or other joint swelling.  Skin: Negative for other color change, or other wound or worsening drainage.  Neurological: Negative for other syncope or numbness. Hematological: Negative for other adenopathy or swelling Psychiatric/Behavioral: Negative for hallucinations, other worsening agitation, SI, self-injury, or new decreased concentration All other system neg per pt    Objective:   Physical Exam BP 122/80   Pulse (!) 58   Temp 98.1 F (36.7 C) (Oral)   Ht 6' (1.829 m)   Wt 185 lb (83.9 kg)   SpO2 98%   BMI 25.09 kg/m  VS noted,  Constitutional: Pt is oriented to person, place, and time. Appears well-developed and well-nourished, in no significant distress and comfortable Head: Normocephalic and atraumatic  Eyes: Conjunctivae and EOM are normal. Pupils are equal, round, and reactive to light Right Ear: External ear normal without discharge Left Ear: External ear normal without  discharge Nose: Nose without discharge or deformity Mouth/Throat: Oropharynx is without other ulcerations and moist  Neck: Normal range of motion. Neck supple. No JVD present. No tracheal deviation present or significant neck LA or mass Cardiovascular: Normal rate, regular rhythm, normal heart sounds and intact distal pulses.   Pulmonary/Chest: WOB normal and breath sounds without rales or wheezing  Abdominal: Soft. Bowel sounds are normal. NT. No HSM  Musculoskeletal: Normal range of motion. Exhibits no edema Lymphadenopathy: Has no other cervical adenopathy.  Neurological: Pt is alert and oriented to person, place, and time. Pt has normal reflexes. No cranial nerve deficit. Motor grossly intact, Gait intact Skin: Skin is warm and dry. No rash noted or new ulcerations except for erythem skin lesion left temple.   Psychiatric:  Has normal mood and affect. Behavior is normal without agitation No other exam findings  Lab Results  Component Value Date   WBC 10.5 06/05/2015   HGB 17.3 (H) 06/05/2015   HCT 51.5 06/05/2015   PLT 335.0 06/05/2015   GLUCOSE 105 (H) 06/05/2015   CHOL 145 06/05/2015   TRIG 165.0 (H) 06/05/2015   HDL 27.10 (L) 06/05/2015   LDLDIRECT 84.1 01/06/2007   LDLCALC 85 06/05/2015   ALT 22 06/05/2015   AST 14 06/05/2015   NA 138 06/05/2015   K 5.0 06/05/2015   CL 101 06/05/2015   CREATININE 1.33 06/05/2015   BUN 30 (H) 06/05/2015   CO2 27 06/05/2015   TSH 0.75 06/05/2015   PSA 1.50 06/05/2015   INR 1.0 ratio 07/13/2009       Assessment & Plan:

## 2016-12-29 NOTE — Assessment & Plan Note (Addendum)
Mild to mod, for pulm referral r/o OSA,  to f/u any worsening symptoms or concerns  In addition to the time spent performing CPE, I spent an additional 15 minutes face to face,in which greater than 50% of this time was spent in counseling and coordination of care for patient's illness as documented, including the differential dx, tx, further evaluation and other management of hypersomnolence, facial skin lesions, hyperglycemia, and HLD

## 2016-12-29 NOTE — Assessment & Plan Note (Signed)
stable overall by history and exam, recent data reviewed with pt, and pt to continue medical treatment as before,  to f/u any worsening symptoms or concerns Lab Results  Component Value Date   LDLCALC 119 (H) 12/26/2016  for lower chol diet, restart statin

## 2016-12-29 NOTE — Assessment & Plan Note (Signed)
Lab Results  Component Value Date   HGBA1C 5.5 12/26/2016  stable overall by history and exam, recent data reviewed with pt, and pt to continue medical treatment as before,  to f/u any worsening symptoms or concerns

## 2016-12-29 NOTE — Assessment & Plan Note (Signed)

## 2016-12-29 NOTE — Assessment & Plan Note (Signed)
Recent onset, for derm referral  - r/o skin ca

## 2017-01-20 ENCOUNTER — Institutional Professional Consult (permissible substitution): Payer: Medicare HMO | Admitting: Pulmonary Disease

## 2017-01-20 ENCOUNTER — Ambulatory Visit (INDEPENDENT_AMBULATORY_CARE_PROVIDER_SITE_OTHER): Payer: Medicare HMO | Admitting: Pulmonary Disease

## 2017-01-20 ENCOUNTER — Encounter: Payer: Self-pay | Admitting: Pulmonary Disease

## 2017-01-20 VITALS — BP 128/72 | HR 57 | Ht 72.0 in | Wt 186.0 lb

## 2017-01-20 DIAGNOSIS — R29818 Other symptoms and signs involving the nervous system: Secondary | ICD-10-CM | POA: Diagnosis not present

## 2017-01-20 NOTE — Patient Instructions (Signed)
Will arrange for home sleep study  Will call to schedule follow up after sleep study reviewed

## 2017-01-20 NOTE — Progress Notes (Signed)
   Subjective:    Patient ID: Danny Young, male    DOB: 25-Dec-1950, 66 y.o.   MRN: [id number]  HPI    Review of Systems  Constitutional: Negative for fever and unexpected weight change.  HENT: Positive for sinus pain and sinus pressure. Negative for congestion, dental problem, ear pain, nosebleeds, postnasal drip, rhinorrhea, sneezing, sore throat and trouble swallowing.   Eyes: Negative for redness and itching.  Respiratory: Negative for cough, chest tightness, shortness of breath and wheezing.   Cardiovascular: Negative for palpitations and leg swelling.  Gastrointestinal: Negative for nausea and vomiting.  Genitourinary: Negative for dysuria.  Musculoskeletal: Negative for joint swelling.  Skin: Negative for rash.  Allergic/Immunologic: Negative.  Negative for environmental allergies, food allergies and immunocompromised state.  Neurological: Negative for headaches.  Hematological: Does not bruise/bleed easily.  Psychiatric/Behavioral: Negative for dysphoric mood. The patient is not nervous/anxious.        Objective:   Physical Exam        Assessment & Plan:

## 2017-01-20 NOTE — Progress Notes (Signed)
Past Surgical History He  has a past surgical history that includes rotater cuff.  No Known Allergies  Family History His family history includes Hypertension in his father and mother.  Social History He  reports that he has never smoked. He has never used smokeless tobacco. He reports that he drinks alcohol. He reports that he does not use drugs.  Review of systems Constitutional: Negative for fever and unexpected weight change.  HENT: Positive for sinus pain and sinus pressure. Negative for congestion, dental problem, ear pain, nosebleeds, postnasal drip, rhinorrhea, sneezing, sore throat and trouble swallowing.   Eyes: Negative for redness and itching.  Respiratory: Negative for cough, chest tightness, shortness of breath and wheezing.   Cardiovascular: Negative for palpitations and leg swelling.  Gastrointestinal: Negative for nausea and vomiting.  Genitourinary: Negative for dysuria.  Musculoskeletal: Negative for joint swelling.  Skin: Negative for rash.  Allergic/Immunologic: Negative.  Negative for environmental allergies, food allergies and immunocompromised state.  Neurological: Negative for headaches.  Hematological: Does not bruise/bleed easily.  Psychiatric/Behavioral: Negative for dysphoric mood. The patient is not nervous/anxious.     Current Outpatient Prescriptions on File Prior to Visit  Medication Sig  . aspirin 81 MG chewable tablet Chew by mouth.  . simvastatin (ZOCOR) 40 MG tablet Take 1 tablet (40 mg total) by mouth at bedtime.   No current facility-administered medications on file prior to visit.     Chief Complaint  Patient presents with  . Sleep Consult    Pt referred by [name] MD, hypersomnolence. Pt states that he is sleepy throughtout the day, and can sleep standing up at times. Pt sleeps around 7 hours a night, but wakes up 7-8 times a night. Pts wife states that he gasps for air at night, snores loudly, and wakes himsef up on occassion.      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  Vital signs BP 128/72 (BP Location: Left Arm, Cuff Size: Normal)   Pulse (!) 57   Ht 6' (1.829 m)   Wt 186 lb (84.4 kg)   SpO2 98%   BMI 25.23 kg/m   History of Present Illness Danny Young is a 66 y.o. male for evaluation of sleep problems.  His wife has been worried about his snoring.  She says he has been getting episodes where he stops breathing.  He also notices waking up at times because his throat feels funny and he makes moaning noises.  He doesn't dream much.  He has trouble finding comfortable position to sleep due to back pain.  He goes to sleep at 10 pm.  He falls asleep immediately.  He wakes up 1 time to use the bathroom.  He gets out of bed at 530 am.  He feels tired in the morning.  He sometimes gets headaches in the morning.  He does not use anything to help him fall sleep.  He drinks coffee in the morning.  He has to take a nap after lunch for about 30 minutes, and will fall asleep sometimes while watching TV.  He denies sleep walking, sleep talking, bruxism, or nightmares.  There is no history of restless legs.  He denies sleep hallucinations, sleep paralysis, or cataplexy.  The Epworth score is 9 out of 24.   Physical Exam:  General - No distress ENT - No sinus tenderness, no  oral exudate, no LAN, no thyromegaly, TM clear, pupils equal/reactive, MP 3, scalloped tongue Cardiac - s1s2 regular, no murmur, pulses symmetric Chest - No wheeze/rales/dullness, good air entry, normal respiratory excursion Back - No focal tenderness Abd - Soft, non-tender, no organomegaly, + bowel sounds Ext - No edema Neuro - Normal strength, cranial nerves intact Skin - No rashes Psych - Normal mood, and  behavior  Discussion: He has snoring, sleep disruption, daytime sleepiness, and witnessed apnea.  I am concerned he could have sleep apnea.  We discussed how sleep apnea can affect various health problems, including risks for hypertension, cardiovascular disease, and diabetes.  We also discussed how sleep disruption can increase risks for accidents, such as while driving.  Weight loss as a means of improving sleep apnea was also reviewed.  Additional treatment options discussed were CPAP therapy, oral appliance, and surgical intervention.  Assessment/plan:  Suspected sleep apnea. - will arrange for home sleep study   Patient Instructions  Will arrange for home sleep study  Will call to schedule follow up after sleep study reviewed   Chesley Mires, M.D. Pager [phone] 01/20/2017, 11:21 AM

## 2017-02-05 DIAGNOSIS — G4733 Obstructive sleep apnea (adult) (pediatric): Secondary | ICD-10-CM | POA: Diagnosis not present

## 2017-02-06 ENCOUNTER — Telehealth: Payer: Self-pay | Admitting: Pulmonary Disease

## 2017-02-06 DIAGNOSIS — G4733 Obstructive sleep apnea (adult) (pediatric): Secondary | ICD-10-CM

## 2017-02-06 NOTE — Telephone Encounter (Signed)
Called and spoke with patient today regarding results per vs.  Informed the patient of her results today and recommendations per vs. The patient verbalized understanding and denied any questions or concerns at this time. Nothing further needed.  Patient is agreeable to set up for new CPAP at this time today. Placed order today for [facility] to reach out to patient for further info. Pt is requested to have [facility]. Pt's wife works with [facility] and would like to be with them. Placing order today. Nothing further needed.

## 2017-02-06 NOTE — Telephone Encounter (Signed)
HST 02/05/17 >> AHI 10.4, SaO2 90%  Will have my nurse inform pt that sleep study shows mild sleep apnea.  Options are 1) CPAP now, 2) ROV first.  If He is agreeable to CPAP, then please send order for auto CPAP range 5 to 15 cm H2O with heated humidity and mask of choice.  Have download sent 1 month after starting CPAP and set up ROV 2 months after starting CPAP.

## 2017-02-07 DIAGNOSIS — G4733 Obstructive sleep apnea (adult) (pediatric): Secondary | ICD-10-CM | POA: Diagnosis not present

## 2017-02-10 ENCOUNTER — Other Ambulatory Visit: Payer: Self-pay | Admitting: *Deleted

## 2017-02-10 DIAGNOSIS — R29818 Other symptoms and signs involving the nervous system: Secondary | ICD-10-CM

## 2017-02-12 DIAGNOSIS — L814 Other melanin hyperpigmentation: Secondary | ICD-10-CM | POA: Diagnosis not present

## 2017-02-12 DIAGNOSIS — L570 Actinic keratosis: Secondary | ICD-10-CM | POA: Diagnosis not present

## 2017-02-12 DIAGNOSIS — Q8789 Other specified congenital malformation syndromes, not elsewhere classified: Secondary | ICD-10-CM | POA: Insufficient documentation

## 2017-02-12 DIAGNOSIS — L821 Other seborrheic keratosis: Secondary | ICD-10-CM | POA: Diagnosis not present

## 2017-06-20 DIAGNOSIS — G4733 Obstructive sleep apnea (adult) (pediatric): Secondary | ICD-10-CM | POA: Diagnosis not present

## 2017-07-18 DIAGNOSIS — G4733 Obstructive sleep apnea (adult) (pediatric): Secondary | ICD-10-CM | POA: Diagnosis not present

## 2017-08-08 ENCOUNTER — Encounter: Payer: Self-pay | Admitting: Pulmonary Disease

## 2017-08-08 ENCOUNTER — Ambulatory Visit: Payer: Medicare HMO | Admitting: Pulmonary Disease

## 2017-08-08 VITALS — BP 120/76 | HR 69 | Ht 72.0 in | Wt 190.6 lb

## 2017-08-08 DIAGNOSIS — G4733 Obstructive sleep apnea (adult) (pediatric): Secondary | ICD-10-CM | POA: Diagnosis not present

## 2017-08-08 NOTE — Progress Notes (Signed)
[facility]  Chief Complaint  Patient presents with  . Follow-up    [facility]-CPAP supplies    Vital signs: BP 120/76 (BP Location: Left Arm, Cuff Size: Normal)   Pulse 69   Ht 6' (1.829 m)   Wt 190 lb 9.6 oz (86.5 kg)   SpO2 98%   BMI 25.85 kg/m   History of Present Illness: Danny Young is a 67 y.o. male with obstructive sleep apnea.  Since his last visit he had sleep study.  Mild sleep apnea.  Started on auto CPAP.  He has a nasal pillow mask.  Mild dryness.  Sleeping better, and more alert.  Still has back pain that causes sleep trouble.  Physical Exam:  General - pleasant Eyes - pupils reactive ENT - no sinus tenderness, no oral exudate, no LAN Cardiac - regular, no murmur Chest - no wheeze, rales Abd - soft, non tender Ext - no edema Skin - no rashes Neuro - normal strength Psych - normal mood   Assessment/Plan:  Obstructive sleep apnea. - he is compliant with CPAP and reports benefit from therapy - continue auto CPAP   Patient Instructions  Follow up in 1 year    Chesley Mires, MD [place] 08/08/2017, 4:05 PM  Flow Sheet  Sleep tests: HST 02/05/17 >> AHI 10.4, SaO2 90% Auto CPAP 07/09/17 to 08/07/17 >> used on 30 of 30 nights with average 6 hrs 51 min.  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  Past Surgical History: He  has a past surgical history that includes rotater cuff.  Family History: His family history includes Hypertension in his father and mother.  Social History: He  reports that he has never smoked. He has never used smokeless tobacco. He reports that he drinks alcohol. He reports  that he does not use drugs.  Medications: Allergies as of 08/08/2017   No Known Allergies     Medication List        Accurate as of 08/08/17  4:05 PM. Always use your most recent med list.          aspirin 81 MG chewable tablet Chew by mouth.   cetirizine 10 MG chewable tablet Commonly known as:  ZYRTEC Chew 1 tablet by mouth daily.   simvastatin 40 MG tablet Commonly known as:  ZOCOR Take 1 tablet (40 mg total) by mouth at bedtime.

## 2017-08-08 NOTE — Patient Instructions (Signed)
Follow up in 1 year.

## 2017-08-18 DIAGNOSIS — G4733 Obstructive sleep apnea (adult) (pediatric): Secondary | ICD-10-CM | POA: Diagnosis not present

## 2017-09-17 DIAGNOSIS — G4733 Obstructive sleep apnea (adult) (pediatric): Secondary | ICD-10-CM | POA: Diagnosis not present

## 2017-10-18 DIAGNOSIS — G4733 Obstructive sleep apnea (adult) (pediatric): Secondary | ICD-10-CM | POA: Diagnosis not present

## 2017-11-17 DIAGNOSIS — G4733 Obstructive sleep apnea (adult) (pediatric): Secondary | ICD-10-CM | POA: Diagnosis not present

## 2017-12-09 DIAGNOSIS — G4733 Obstructive sleep apnea (adult) (pediatric): Secondary | ICD-10-CM | POA: Diagnosis not present

## 2017-12-18 DIAGNOSIS — G4733 Obstructive sleep apnea (adult) (pediatric): Secondary | ICD-10-CM | POA: Diagnosis not present

## 2018-01-18 DIAGNOSIS — G4733 Obstructive sleep apnea (adult) (pediatric): Secondary | ICD-10-CM | POA: Diagnosis not present

## 2018-02-01 ENCOUNTER — Other Ambulatory Visit: Payer: Self-pay | Admitting: Internal Medicine

## 2018-02-10 ENCOUNTER — Encounter: Payer: Self-pay | Admitting: Internal Medicine

## 2018-02-10 ENCOUNTER — Ambulatory Visit (INDEPENDENT_AMBULATORY_CARE_PROVIDER_SITE_OTHER): Payer: Medicare HMO | Admitting: Internal Medicine

## 2018-02-10 ENCOUNTER — Other Ambulatory Visit (INDEPENDENT_AMBULATORY_CARE_PROVIDER_SITE_OTHER): Payer: Medicare HMO

## 2018-02-10 ENCOUNTER — Other Ambulatory Visit: Payer: Self-pay | Admitting: Internal Medicine

## 2018-02-10 VITALS — BP 124/78 | HR 63 | Temp 98.3°F | Ht 72.0 in | Wt 192.0 lb

## 2018-02-10 DIAGNOSIS — R059 Cough, unspecified: Secondary | ICD-10-CM

## 2018-02-10 DIAGNOSIS — R739 Hyperglycemia, unspecified: Secondary | ICD-10-CM

## 2018-02-10 DIAGNOSIS — Z0001 Encounter for general adult medical examination with abnormal findings: Secondary | ICD-10-CM | POA: Diagnosis not present

## 2018-02-10 DIAGNOSIS — R05 Cough: Secondary | ICD-10-CM | POA: Diagnosis not present

## 2018-02-10 DIAGNOSIS — Z1211 Encounter for screening for malignant neoplasm of colon: Secondary | ICD-10-CM | POA: Diagnosis not present

## 2018-02-10 DIAGNOSIS — Z23 Encounter for immunization: Secondary | ICD-10-CM

## 2018-02-10 DIAGNOSIS — R062 Wheezing: Secondary | ICD-10-CM | POA: Diagnosis not present

## 2018-02-10 LAB — URINALYSIS, ROUTINE W REFLEX MICROSCOPIC
BILIRUBIN URINE: NEGATIVE
HGB URINE DIPSTICK: NEGATIVE
KETONES UR: NEGATIVE
Leukocytes, UA: NEGATIVE
NITRITE: NEGATIVE
RBC / HPF: NONE SEEN (ref 0–?)
Specific Gravity, Urine: 1.02 (ref 1.000–1.030)
Total Protein, Urine: NEGATIVE
URINE GLUCOSE: NEGATIVE
UROBILINOGEN UA: 0.2 (ref 0.0–1.0)
WBC, UA: NONE SEEN (ref 0–?)
pH: 6 (ref 5.0–8.0)

## 2018-02-10 LAB — BASIC METABOLIC PANEL
BUN: 23 mg/dL (ref 6–23)
CALCIUM: 9.7 mg/dL (ref 8.4–10.5)
CO2: 30 meq/L (ref 19–32)
Chloride: 104 mEq/L (ref 96–112)
Creatinine, Ser: 1.06 mg/dL (ref 0.40–1.50)
GFR: 74.21 mL/min (ref >60.00)
GLUCOSE: 99 mg/dL (ref 70–99)
POTASSIUM: 5.1 meq/L (ref 3.5–5.1)
Sodium: 139 mEq/L (ref 135–145)

## 2018-02-10 LAB — LIPID PANEL
CHOL/HDL RATIO: 5
Cholesterol: 147 mg/dL (ref 0–200)
HDL: 32.5 mg/dL — AB (ref >39.00)
LDL CALC: 82 mg/dL (ref 0–99)
NONHDL: 114.28
Triglycerides: 160 mg/dL — ABNORMAL HIGH (ref 0.0–149.0)
VLDL: 32 mg/dL (ref 0.0–40.0)

## 2018-02-10 LAB — HEPATIC FUNCTION PANEL
ALT: 17 U/L (ref 0–53)
AST: 18 U/L (ref 0–37)
Albumin: 4.6 g/dL (ref 3.5–5.2)
Alkaline Phosphatase: 65 U/L (ref 39–117)
BILIRUBIN DIRECT: 0.2 mg/dL (ref 0.0–0.3)
BILIRUBIN TOTAL: 1 mg/dL (ref 0.2–1.2)
Total Protein: 7.4 g/dL (ref 6.0–8.3)

## 2018-02-10 LAB — CBC WITH DIFFERENTIAL/PLATELET
Basophils Absolute: 0 10*3/uL (ref 0.0–0.1)
Basophils Relative: 0.5 % (ref 0.0–3.0)
EOS ABS: 0.1 10*3/uL (ref 0.0–0.7)
Eosinophils Relative: 1.4 % (ref 0.0–5.0)
HCT: 47.6 % (ref 39.0–52.0)
HEMOGLOBIN: 16.3 g/dL (ref 13.0–17.0)
LYMPHS ABS: 1.6 10*3/uL (ref 0.7–4.0)
Lymphocytes Relative: 29.2 % (ref 12.0–46.0)
MCHC: 34.1 g/dL (ref 30.0–36.0)
MCV: 93.9 fl (ref 78.0–100.0)
MONO ABS: 0.4 10*3/uL (ref 0.1–1.0)
MONOS PCT: 7.8 % (ref 3.0–12.0)
NEUTROS ABS: 3.4 10*3/uL (ref 1.4–7.7)
Neutrophils Relative %: 61.1 % (ref 43.0–77.0)
Platelets: 180 10*3/uL (ref 150.0–400.0)
RBC: 5.07 Mil/uL (ref 4.22–5.81)
RDW: 12.7 % (ref 11.5–15.5)
WBC: 5.6 10*3/uL (ref 4.0–10.5)

## 2018-02-10 LAB — TSH: TSH: 1 u[IU]/mL (ref 0.35–4.50)

## 2018-02-10 LAB — HEMOGLOBIN A1C: Hgb A1c MFr Bld: 5.3 % (ref 4.6–6.5)

## 2018-02-10 LAB — PSA: PSA: 2.16 ng/mL (ref 0.10–4.00)

## 2018-02-10 MED ORDER — AZITHROMYCIN 250 MG PO TABS: ORAL_TABLET | ORAL | 1 refills | Status: DC

## 2018-02-10 MED ORDER — METHYLPREDNISOLONE 4 MG PO TBPK: ORAL_TABLET | ORAL | 0 refills | Status: DC

## 2018-02-10 MED ORDER — SIMVASTATIN 40 MG PO TABS: 40.0000 mg | ORAL_TABLET | Freq: Every day | ORAL | 1 refills | Status: DC

## 2018-02-10 MED ORDER — HYDROCODONE-HOMATROPINE 5-1.5 MG/5ML PO SYRP: 5.0000 mL | ORAL_SOLUTION | Freq: Four times a day (QID) | ORAL | 0 refills | Status: AC | PRN

## 2018-02-10 MED ORDER — HYDROCODONE-HOMATROPINE 5-1.5 MG/5ML PO SYRP: 5.0000 mL | ORAL_SOLUTION | Freq: Four times a day (QID) | ORAL | 0 refills | Status: DC | PRN

## 2018-02-10 NOTE — Assessment & Plan Note (Signed)
stable overall by history and exam, recent data reviewed with pt, and pt to continue medical treatment as before,  to f/u any worsening symptoms or concerns  

## 2018-02-10 NOTE — Telephone Encounter (Signed)
Copied from [place] [id number]. Topic: Quick Communication - Rx Refill/Question >> Feb 10, 2018  5:25 PM Cecelia Byars, NT wrote: Medication: simvastatin (ZOCOR) 40 MG tablet  Has the patient contacted their pharmacy? yes  (Agent: If no, request that the patient contact the pharmacy for the refill. (Agent: If yes, when and what did the pharmacy advise?  Preferred Pharmacy (with phone number or street name [id number] - [place], [place] - [address] [phone] (Phone) [phone] (Fax)    Agent: Please be advised that RX refills may take up to 3 business days. We ask that you follow-up with your pharmacy.

## 2018-02-10 NOTE — Assessment & Plan Note (Addendum)
]  Mild to mod, c/w bronchitis vs pna, declines cxr,  for antibx course, cough med prn,  to f/u any worsening symptoms or concerns  In addition to the time spent performing CPE, I spent an additional 25 minutes face to face,in which greater than 50% of this time was spent in counseling and coordination of care for patient's acute illness as documented, including the differential dx, treatment, further evaluation and other management of cough, wheezing and hyperglycemia

## 2018-02-10 NOTE — Assessment & Plan Note (Signed)

## 2018-02-10 NOTE — Patient Instructions (Addendum)
Please take all new medication as prescribed - the antibiotic, cough medicine, and medrol for wheezing  Please continue all other medications as before, and refills have been done if requested.  Please have the pharmacy call with any other refills you may need.  Please make a Nurse Visit appt to have the Prevnar 13 pneumonia shot done at your convenience  Please continue your efforts at being more active, low cholesterol diet, and weight control.  You are otherwise up to date with prevention measures today.  Please keep your appointments with your specialists as you may have planned  Please go to the LAB in the Basement (turn left off the elevator) for the tests to be done today  .You will be contacted by phone if any changes need to be made immediately.  Otherwise, you will receive a letter about your results with an explanation, but please check with MyChart first.  Please remember to sign up for MyChart if you have not done so, as this will be important to you in the future with finding out test results, communicating by private email, and scheduling acute appointments online when needed.  Please return in 1 year for your yearly visit, or sooner if needed, with Lab testing done 3-5 days before

## 2018-02-10 NOTE — Assessment & Plan Note (Signed)
Mild to mod, for medrol pack asd,  to f/u any worsening symptoms or concerns 

## 2018-02-10 NOTE — Progress Notes (Signed)
Subjective:    Patient ID: Danny Young, male    DOB: [birth year], 67 y.o.   MRN: [id number]  HPI  Here for wellness and f/u;  Overall doing ok;  Pt denies Chest pain, orthopnea, PND, worsening LE edema, palpitations, dizziness or syncope.  Pt denies neurological change such as new headache, facial or extremity weakness.  Pt denies polydipsia, polyuria, or low sugar symptoms. Pt states overall good compliance with treatment and medications, good tolerability, and has been trying to follow appropriate diet.  Pt denies worsening depressive symptoms, suicidal ideation or panic. No fever, night sweats, wt loss, loss of appetite, or other constitutional symptoms.  Pt states good ability with ADL's, has low fall risk, home safety reviewed and adequate, no other significant changes in hearing or vision, and only occasionally active with exercise.  Also, Here with acute onset mild to mod 2-3 days ST, HA, general weakness and malaise, with prod cough greenish sputum, but Pt denies chest pain, increased sob or doe, wheezing, orthopnea, PND, increased LE swelling, palpitations, dizziness or syncope, except for onset mild sob and wheezing x 1 day Past Medical History:  Diagnosis Date  . ALLERGIC RHINITIS 01/19/2007   Qualifier: Diagnosis of  By: Jenny Reichmann MD, Hunt Oris   . 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 GERD 01/19/2007   Qualifier: Diagnosis of  By: Jenny Reichmann MD, Hunt Oris   . HYPERLIPIDEMIA 01/19/2007   Qualifier: Diagnosis of  By: Jenny Reichmann MD, Hunt Oris   . MIGRAINE HEADACHE 01/19/2007   Qualifier: Diagnosis of  By: Jenny Reichmann MD, Hunt Oris   . 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 rotater cuff     left    reports that he has never smoked. He has never used smokeless tobacco. He reports that he drinks alcohol. He reports that he does not use drugs. family history includes Hypertension in his father and  mother. No Known Allergies Current Outpatient Medications on File Prior to Visit  Medication Sig Dispense Refill  . aspirin 81 MG chewable tablet Chew by mouth.    . cetirizine (ZYRTEC) 10 MG chewable tablet Chew 1 tablet by mouth daily.     No current facility-administered medications on file prior to visit.    Review of Systems Constitutional: Negative for other unusual diaphoresis, sweats, appetite or weight changes HENT: Negative for other worsening hearing loss, ear pain, facial swelling, mouth sores or neck stiffness.   Eyes: Negative for other worsening pain, redness or other visual disturbance.  Respiratory: Negative for other stridor or swelling Cardiovascular: Negative for other palpitations or other chest pain  Gastrointestinal: Negative for worsening diarrhea or loose stools, blood in stool, distention or other pain Genitourinary: Negative for hematuria, flank pain or other change in urine volume.  Musculoskeletal: Negative for myalgias or other joint swelling.  Skin: Negative for other color change, or other wound or worsening drainage.  Neurological: Negative for other syncope or numbness. Hematological: Negative for other adenopathy or swelling Psychiatric/Behavioral: Negative for hallucinations, other worsening agitation, SI, self-injury, or new decreased concentration All other system neg per pt    Objective:   Physical Exam BP 124/78   Pulse 63   Temp 98.3 F (36.8 C) (Oral)   Ht 6' (1.829 m)   Wt 192 lb (87.1 kg)   SpO2 96%   BMI 26.04 kg/m  VS noted, mild ill Constitutional: Pt is oriented to  person, place, and time. Appears well-developed and well-nourished, in no significant distress and comfortable Head: Normocephalic and atraumatic  Eyes: Conjunctivae and EOM are normal. Pupils are equal, round, and reactive to light Bilat tm's with mild erythema.  Max sinus areas mild tender.  Pharynx with mild erythema, no exudate Right Ear: External ear normal  without discharge Left Ear: External ear normal without discharge Nose: Nose without discharge or deformity Mouth/Throat: Oropharynx is without other ulcerations and moist  Neck: Normal range of motion. Neck supple. No JVD present. No tracheal deviation present or significant neck LA or mass Cardiovascular: Normal rate, regular rhythm, normal heart sounds and intact distal pulses.   Pulmonary/Chest: WOB normal and breath sounds decreased without rales but with few bilat wheezing  Abdominal: Soft. Bowel sounds are normal. NT. No HSM  Musculoskeletal: Normal range of motion. Exhibits no edema Lymphadenopathy: Has no other cervical adenopathy.  Neurological: Pt is alert and oriented to person, place, and time. Pt has normal reflexes. No cranial nerve deficit. Motor grossly intact, Gait intact Skin: Skin is warm and dry. No rash noted or new ulcerations Psychiatric:  Has normal mood and affect. Behavior is normal without agitation No other exam findings Lab Results  Component Value Date   WBC 5.6 02/10/2018   HGB 16.3 02/10/2018   HCT 47.6 02/10/2018   PLT 180.0 02/10/2018   GLUCOSE 99 02/10/2018   CHOL 147 02/10/2018   TRIG 160.0 (H) 02/10/2018   HDL 32.50 (L) 02/10/2018   LDLDIRECT 84.1 01/06/2007   LDLCALC 82 02/10/2018   ALT 17 02/10/2018   AST 18 02/10/2018   NA 139 02/10/2018   K 5.1 02/10/2018   CL 104 02/10/2018   CREATININE 1.06 02/10/2018   BUN 23 02/10/2018   CO2 30 02/10/2018   TSH 1.00 02/10/2018   PSA 2.16 02/10/2018   INR 1.0 ratio 07/13/2009   HGBA1C 5.3 02/10/2018       Assessment & Plan:

## 2018-02-17 DIAGNOSIS — G4733 Obstructive sleep apnea (adult) (pediatric): Secondary | ICD-10-CM | POA: Diagnosis not present

## 2018-02-17 DIAGNOSIS — H524 Presbyopia: Secondary | ICD-10-CM | POA: Diagnosis not present

## 2018-02-17 DIAGNOSIS — H25043 Posterior subcapsular polar age-related cataract, bilateral: Secondary | ICD-10-CM | POA: Diagnosis not present

## 2018-02-18 DIAGNOSIS — C44519 Basal cell carcinoma of skin of other part of trunk: Secondary | ICD-10-CM | POA: Diagnosis not present

## 2018-02-18 DIAGNOSIS — D485 Neoplasm of uncertain behavior of skin: Secondary | ICD-10-CM | POA: Diagnosis not present

## 2018-02-18 DIAGNOSIS — C4441 Basal cell carcinoma of skin of scalp and neck: Secondary | ICD-10-CM | POA: Diagnosis not present

## 2018-02-18 DIAGNOSIS — D045 Carcinoma in situ of skin of trunk: Secondary | ICD-10-CM | POA: Diagnosis not present

## 2018-02-18 DIAGNOSIS — L821 Other seborrheic keratosis: Secondary | ICD-10-CM | POA: Diagnosis not present

## 2018-02-18 DIAGNOSIS — L570 Actinic keratosis: Secondary | ICD-10-CM | POA: Diagnosis not present

## 2018-03-10 ENCOUNTER — Telehealth: Payer: Self-pay | Admitting: Internal Medicine

## 2018-03-10 NOTE — Telephone Encounter (Signed)
Copied from [place] [id number]. Topic: Quick Communication - See Telephone Encounter >> Mar 10, 2018  2:37 PM Antonieta Iba C wrote: CRM for notification. See Telephone encounter for: 03/10/18.  Pt called in to be advised. Pt says that he was seen and prescribed azithromycin (ZITHROMAX Z-PAK) 250 MG tablet  and also methylPREDNISolone (MEDROL DOSEPAK) 4 MG TBPK tablet. Pt says that he feels that they helped a bit but he still have phlym in his chest. Pt would like to be advised further by provider   CB: [phone]

## 2018-03-10 NOTE — Telephone Encounter (Signed)
If no fever, worsening cough, wheezing or other pain or sob, ok to take mucinex otc as well, as the congestion often takes some week or so after the infection to go away

## 2018-03-11 NOTE — Telephone Encounter (Signed)
Pt has been informed.

## 2018-03-20 DIAGNOSIS — G4733 Obstructive sleep apnea (adult) (pediatric): Secondary | ICD-10-CM | POA: Diagnosis not present

## 2018-04-08 ENCOUNTER — Ambulatory Visit (INDEPENDENT_AMBULATORY_CARE_PROVIDER_SITE_OTHER): Payer: Medicare HMO | Admitting: Internal Medicine

## 2018-04-08 ENCOUNTER — Ambulatory Visit (INDEPENDENT_AMBULATORY_CARE_PROVIDER_SITE_OTHER)
Admission: RE | Admit: 2018-04-08 | Discharge: 2018-04-08 | Disposition: A | Discharge status: Home / Self Care | Payer: Medicare HMO | Source: Ambulatory Visit | Attending: Internal Medicine | Admitting: Internal Medicine

## 2018-04-08 ENCOUNTER — Encounter: Payer: Self-pay | Admitting: Internal Medicine

## 2018-04-08 VITALS — BP 124/62 | HR 68 | Temp 98.4°F | Ht 72.0 in | Wt 192.0 lb

## 2018-04-08 DIAGNOSIS — R739 Hyperglycemia, unspecified: Secondary | ICD-10-CM | POA: Diagnosis not present

## 2018-04-08 DIAGNOSIS — R059 Cough, unspecified: Secondary | ICD-10-CM

## 2018-04-08 DIAGNOSIS — R05 Cough: Secondary | ICD-10-CM

## 2018-04-08 DIAGNOSIS — R062 Wheezing: Secondary | ICD-10-CM | POA: Diagnosis not present

## 2018-04-08 DIAGNOSIS — Z23 Encounter for immunization: Secondary | ICD-10-CM | POA: Diagnosis not present

## 2018-04-08 MED ORDER — METHYLPREDNISOLONE 4 MG PO TBPK: ORAL_TABLET | ORAL | 0 refills | Status: DC

## 2018-04-08 MED ORDER — DOXYCYCLINE HYCLATE 100 MG PO TABS: 100.0000 mg | ORAL_TABLET | Freq: Two times a day (BID) | ORAL | 0 refills | Status: DC

## 2018-04-08 NOTE — Assessment & Plan Note (Signed)
stable overall by history and exam, recent data reviewed with pt, and pt to continue medical treatment as before,  to f/u any worsening symptoms or concerns  

## 2018-04-08 NOTE — Assessment & Plan Note (Addendum)
Very midl at best on exam today, ? Underlying asthma, for cxr, declines inhaler prn, for low dose short course medrol

## 2018-04-08 NOTE — Progress Notes (Signed)
Subjective:    Patient ID: Danny Young, male    DOB: Feb 10, 1951, 67 y.o.   MRN: [id number]  HPI  Here with 5 days acute onset fever, facial pain, pressure, headache, general weakness and malaise, and greenish d/c, with mild ST and cough, but pt denies chest pain, increased sob or doe, orthopnea, PND, increased LE swelling, palpitations, dizziness or syncope.  Has had unsusual scant prod cough for several months intermittent with reported wheeziness.  Pt denies new neurological symptoms such as new headache, or facial or extremity weakness or numbness   Pt denies polydipsia, polyuria.  Last cxr 2017 Past Medical History:  Diagnosis Date  . ALLERGIC RHINITIS 01/19/2007   Qualifier: Diagnosis of  By: Jenny Reichmann MD, Hunt Oris   . 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 GERD 01/19/2007   Qualifier: Diagnosis of  By: Jenny Reichmann MD, Hunt Oris   . HYPERLIPIDEMIA 01/19/2007   Qualifier: Diagnosis of  By: Jenny Reichmann MD, Hunt Oris   . MIGRAINE HEADACHE 01/19/2007   Qualifier: Diagnosis of  By: Jenny Reichmann MD, Hunt Oris   . 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 rotater cuff     left    reports that he has never smoked. He has never used smokeless tobacco. He reports that he drinks alcohol. He reports that he does not use drugs. family history includes Hypertension in his father and mother. No Known Allergies Current Outpatient Medications on File Prior to Visit  Medication Sig Dispense Refill  . aspirin 81 MG chewable tablet Chew by mouth.    . cetirizine (ZYRTEC) 10 MG chewable tablet Chew 1 tablet by mouth daily.    . simvastatin (ZOCOR) 40 MG tablet Take 1 tablet (40 mg total) by mouth at bedtime. 90 tablet 1   No current facility-administered medications on file prior to visit.    Review of Systems  Constitutional: Negative for other unusual diaphoresis or sweats HENT: Negative for ear discharge or  swelling Eyes: Negative for other worsening visual disturbances Respiratory: Negative for stridor or other swelling  Gastrointestinal: Negative for worsening distension or other blood Genitourinary: Negative for retention or other urinary change Musculoskeletal: Negative for other MSK pain or swelling Skin: Negative for color change or other new lesions Neurological: Negative for worsening tremors and other numbness  Psychiatric/Behavioral: Negative for worsening agitation or other fatigue All other system neg per pt    Objective:   Physical Exam BP 124/62   Pulse 68   Temp 98.4 F (36.9 C) (Oral)   Ht 6' (1.829 m)   Wt 192 lb (87.1 kg)   SpO2 97%   BMI 26.04 kg/m  VS noted, mild ill Constitutional: Pt appears in NAD HENT: Head: NCAT.  Right Ear: External ear normal.  Left Ear: External ear normal.  Bilat tm's with mild erythema.  Max sinus areas mild tender.  Pharynx with mild erythema, no exudate Eyes: . Pupils are equal, round, and reactive to light. Conjunctivae and EOM are normal Nose: without d/c or deformity Neck: Neck supple. Gross normal ROM Cardiovascular: Normal rate and regular rhythm.   Pulmonary/Chest: Effort normal and breath sounds without rales but with trace wheezing.  Abd:  Soft, NT, ND, + BS, no organomegaly Neurological: Pt is alert. At baseline orientation, motor grossly intact Skin: Skin is warm. No rashes, other new lesions, no LE edema Psychiatric: Pt behavior is normal without agitation  No other exam findings Lab Results  Component Value Date   WBC 5.6 02/10/2018   HGB 16.3 02/10/2018   HCT 47.6 02/10/2018   PLT 180.0 02/10/2018   GLUCOSE 99 02/10/2018   CHOL 147 02/10/2018   TRIG 160.0 (H) 02/10/2018   HDL 32.50 (L) 02/10/2018   LDLDIRECT 84.1 01/06/2007   LDLCALC 82 02/10/2018   ALT 17 02/10/2018   AST 18 02/10/2018   NA 139 02/10/2018   K 5.1 02/10/2018   CL 104 02/10/2018   CREATININE 1.06 02/10/2018   BUN 23 02/10/2018   CO2 30  02/10/2018   TSH 1.00 02/10/2018   PSA 2.16 02/10/2018   INR 1.0 ratio 07/13/2009   HGBA1C 5.3 02/10/2018       Assessment & Plan:

## 2018-04-08 NOTE — Patient Instructions (Addendum)
Please take all new medication as prescribed - the antibiotic, and prednisone  Please continue all other medications as before, including the cough medicine at home  You had the Prevnar 13 pneumonia shot today  Please have the pharmacy call with any other refills you may need.  Please keep your appointments with your specialists as you may have planned  Please go to the XRAY Department in the Basement (go straight as you get off the elevator) for the x-ray testing  You will be contacted by phone if any changes need to be made immediately.  Otherwise, you will receive a letter about your results with an explanation, but please check with MyChart first.  Please remember to sign up for MyChart if you have not done so, as this will be important to you in the future with finding out test results, communicating by private email, and scheduling acute appointments online when needed.

## 2018-04-08 NOTE — Assessment & Plan Note (Signed)
Mild to mod, for antibx course,  to f/u any worsening symptoms or concerns 

## 2018-04-09 DIAGNOSIS — Z0100 Encounter for examination of eyes and vision without abnormal findings: Secondary | ICD-10-CM | POA: Diagnosis not present

## 2018-04-19 DIAGNOSIS — G4733 Obstructive sleep apnea (adult) (pediatric): Secondary | ICD-10-CM | POA: Diagnosis not present

## 2018-05-13 ENCOUNTER — Ambulatory Visit: Payer: Medicare HMO

## 2018-05-20 DIAGNOSIS — R69 Illness, unspecified: Secondary | ICD-10-CM | POA: Diagnosis not present

## 2018-07-13 ENCOUNTER — Ambulatory Visit: Payer: Self-pay

## 2018-07-13 NOTE — Telephone Encounter (Signed)
Pt c/o persistent cough for 3-4 weeks. Pt c/o cough is congested and has occasional coughing spells. Pt c/o intermittent wheezing since cough began and is SOB with exertion. No fever. Pt is coughing up yellow thick phlegm. Pt also with runny nose and feels like chest is "full". Pt given an appointment with PCP tomorrow morning. Pt given care advice and pt verbalized understanding.   Reason for Disposition . Cough has been present for > 3 weeks  Additional Information . Commented on: Wheezing is present    Pt stated he is had wheezing on and off for 2 weeks  Answer Assessment - Initial Assessment Questions 1. ONSET: "When did the cough begin?"     3-4 weeks ago 2. SEVERITY: "How bad is the cough today?"      Congested cough and productive cough occasional coughing spells  3. RESPIRATORY DISTRESS: "Describe your breathing."      Wheezing, SOB with exertion only 4. FEVER: "Do you have a fever?" If so, ask: "What is your temperature, how was it measured, and when did it start?"     no 5. SPUTUM: "Describe the color of your sputum" (clear, white, yellow, green)     Yellow thick phlegm 6. HEMOPTYSIS: "Are you coughing up any blood?" If so ask: "How much?" (flecks, streaks, tablespoons, etc.)     no 7. CARDIAC HISTORY: "Do you have any history of heart disease?" (e.g., heart attack, congestive heart failure)      no 8. LUNG HISTORY: "Do you have any history of lung disease?"  (e.g., pulmonary embolus, asthma, emphysema)     no 9. PE RISK FACTORS: "Do you have a history of blood clots?" (or: recent major surgery, recent prolonged travel, bedridden)     no 10. OTHER SYMPTOMS: "Do you have any other symptoms?" (e.g., runny nose, wheezing, chest pain)       Runny nose, wheezing, chest feels full 11. PREGNANCY: "Is there any chance you are pregnant?" "When was your last menstrual period?"       n/a 12. TRAVEL: "Have you traveled out of the country in the last month?" (e.g., travel history,  exposures)       No international travel  Protocols used: [place]

## 2018-07-14 ENCOUNTER — Other Ambulatory Visit: Payer: Self-pay

## 2018-07-14 ENCOUNTER — Encounter: Payer: Self-pay | Admitting: Internal Medicine

## 2018-07-14 ENCOUNTER — Telehealth: Payer: Self-pay | Admitting: *Deleted

## 2018-07-14 ENCOUNTER — Ambulatory Visit (INDEPENDENT_AMBULATORY_CARE_PROVIDER_SITE_OTHER): Payer: Medicare HMO | Admitting: Internal Medicine

## 2018-07-14 VITALS — BP 124/78 | HR 71 | Temp 98.5°F | Ht 72.0 in | Wt 195.0 lb

## 2018-07-14 DIAGNOSIS — R05 Cough: Secondary | ICD-10-CM | POA: Diagnosis not present

## 2018-07-14 DIAGNOSIS — R6889 Other general symptoms and signs: Secondary | ICD-10-CM | POA: Diagnosis not present

## 2018-07-14 DIAGNOSIS — J309 Allergic rhinitis, unspecified: Secondary | ICD-10-CM

## 2018-07-14 DIAGNOSIS — R059 Cough, unspecified: Secondary | ICD-10-CM

## 2018-07-14 DIAGNOSIS — R739 Hyperglycemia, unspecified: Secondary | ICD-10-CM

## 2018-07-14 LAB — POCT INFLUENZA A/B
INFLUENZA A, POC: NEGATIVE
Influenza B, POC: NEGATIVE

## 2018-07-14 MED ORDER — FEXOFENADINE HCL 180 MG PO TABS: 180.0000 mg | ORAL_TABLET | Freq: Every day | ORAL | 2 refills | Status: DC

## 2018-07-14 MED ORDER — ALBUTEROL SULFATE HFA 108 (90 BASE) MCG/ACT IN AERS: 2.0000 | INHALATION_SPRAY | Freq: Four times a day (QID) | RESPIRATORY_TRACT | 11 refills | Status: DC | PRN

## 2018-07-14 MED ORDER — PREDNISONE 10 MG PO TABS: ORAL_TABLET | ORAL | 0 refills | Status: DC

## 2018-07-14 MED ORDER — TRIAMCINOLONE ACETONIDE 55 MCG/ACT NA AERO: 2.0000 | INHALATION_SPRAY | Freq: Every day | NASAL | 12 refills | Status: DC

## 2018-07-14 MED ORDER — METHYLPREDNISOLONE ACETATE 80 MG/ML IJ SUSP
80.0000 mg | Freq: Once | INTRAMUSCULAR | Status: AC
  Administered 2018-07-14: 80 mg via INTRAMUSCULAR

## 2018-07-14 NOTE — Assessment & Plan Note (Signed)
Mild, intermittent, scant productive ongoing for 4 wks with sob wheezing unusual for him, but no fever chills or ST or other pain; recent cxr negative; rapid flu neg today, doubt need coronavirus testing by hx and exam; will tx with depomedrol IM 80, predpac asd, and albuterol HFA prn

## 2018-07-14 NOTE — Patient Instructions (Addendum)
Your flu testing was negative  You had the steroid shot today  Please take all new medication as prescribed - the prednisone low dose, and allegra and Nasacort OTC, and inhaler as needed  Please continue all other medications as before, and refills have been done if requested.  Please have the pharmacy call with any other refills you may need.  Please continue your efforts at being more active, low cholesterol diet, and weight control.  You are otherwise up to date with prevention measures today.  Please keep your appointments with your specialists as you may have planned

## 2018-07-14 NOTE — Assessment & Plan Note (Signed)
stable overall by history and exam, recent data reviewed with pt, and pt to continue medical treatment as before,  to f/u any worsening symptoms or concerns  

## 2018-07-14 NOTE — Progress Notes (Signed)
Subjective:    Patient ID: Danny Young, male    DOB: 1950/06/20, 68 y.o.   MRN: [id number]  HPI  Here with 4 wks scant prod cough assoc with some nasal congestion mostly clearish and mild wheezing/sob, mild, intermittent, not really worse at night, nothing else makes better or worse, and denies fever, chills, Pt denies chest pain, orthopnea, PND, increased LE swelling, palpitations, dizziness or syncope.  Was last seem dec 2019, cxr neg, prednisone helped at the time, but can only tolerate lower doses due to shakiness with higher dosing.  Pt denies new neurological symptoms such as new headache, or facial or extremity weakness or numbness   Pt denies polydipsia, polyuria.  Has been exposed to wife with recent illness unclear but feels bad, cough, feverish and having ct chest today per her provider Past Medical History:  Diagnosis Date  . ALLERGIC RHINITIS 01/19/2007   Qualifier: Diagnosis of  By: Jenny Reichmann MD, Hunt Oris   . 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 GERD 01/19/2007   Qualifier: Diagnosis of  By: Jenny Reichmann MD, Hunt Oris   . HYPERLIPIDEMIA 01/19/2007   Qualifier: Diagnosis of  By: Jenny Reichmann MD, Hunt Oris   . MIGRAINE HEADACHE 01/19/2007   Qualifier: Diagnosis of  By: Jenny Reichmann MD, Hunt Oris   . 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 rotater cuff     left    reports that he has never smoked. He has never used smokeless tobacco. He reports current alcohol use. He reports that he does not use drugs. family history includes Hypertension in his father and mother. No Known Allergies Current Outpatient Medications on File Prior to Visit  Medication Sig Dispense Refill  . aspirin 81 MG chewable tablet Chew by mouth.    . cetirizine (ZYRTEC) 10 MG chewable tablet Chew 1 tablet by mouth daily.    . simvastatin (ZOCOR) 40 MG tablet Take 1 tablet (40 mg total) by mouth at bedtime. 90 tablet 1   No  current facility-administered medications on file prior to visit.    Review of Systems  Constitutional: Negative for other unusual diaphoresis or sweats HENT: Negative for ear discharge or swelling Eyes: Negative for other worsening visual disturbances Respiratory: Negative for stridor or other swelling  Gastrointestinal: Negative for worsening distension or other blood Genitourinary: Negative for retention or other urinary change Musculoskeletal: Negative for other MSK pain or swelling Skin: Negative for color change or other new lesions Neurological: Negative for worsening tremors and other numbness  Psychiatric/Behavioral: Negative for worsening agitation or other fatigue All other system neg per pt    Objective:   Physical Exam BP 124/78   Pulse 71   Temp 98.5 F (36.9 C) (Oral)   Ht 6' (1.829 m)   Wt 195 lb (88.5 kg)   SpO2 96%   BMI 26.45 kg/m  VS noted,  Constitutional: Pt appears in NAD HENT: Head: NCAT.  Right Ear: External ear normal.  Left Ear: External ear normal.  Eyes: . Pupils are equal, round, and reactive to light. Conjunctivae and EOM are normal Nose: without d/c or deformity Neck: Neck supple. Gross normal ROM Cardiovascular: Normal rate and regular rhythm.   Pulmonary/Chest: Effort normal and breath sounds decreased bialt without rales or wheezing.  Abd:  Soft, NT, ND, + BS, no organomegaly Neurological: Pt is alert. At baseline orientation, motor grossly intact Skin: Skin is warm.  No rashes, other new lesions, no LE edema Psychiatric: Pt behavior is normal without agitation  No other exam findings Lab Results  Component Value Date   WBC 5.6 02/10/2018   HGB 16.3 02/10/2018   HCT 47.6 02/10/2018   PLT 180.0 02/10/2018   GLUCOSE 99 02/10/2018   CHOL 147 02/10/2018   TRIG 160.0 (H) 02/10/2018   HDL 32.50 (L) 02/10/2018   LDLDIRECT 84.1 01/06/2007   LDLCALC 82 02/10/2018   ALT 17 02/10/2018   AST 18 02/10/2018   NA 139 02/10/2018   K 5.1  02/10/2018   CL 104 02/10/2018   CREATININE 1.06 02/10/2018   BUN 23 02/10/2018   CO2 30 02/10/2018   TSH 1.00 02/10/2018   PSA 2.16 02/10/2018   INR 1.0 ratio 07/13/2009   HGBA1C 5.3 02/10/2018   Dec 2019 - cxr - neg for acute  Rapid flu  - negative    Assessment & Plan:

## 2018-07-14 NOTE — Assessment & Plan Note (Signed)
Suspect this is cause for cough; for tx as above, but also allegra and nasacort asd,  to f/u any worsening symptoms or concerns

## 2018-07-14 NOTE — Telephone Encounter (Signed)
I called pt to follow up with him about his cough x 3-4 weeks. He denies fever. He does have SOB and wheezing. He has been seen for this before and he feels his symptoms are similar except his SOB is some worse than usual when he has similar illness. Advised pt to keep OV today with PCP at 10:00.

## 2018-08-07 ENCOUNTER — Other Ambulatory Visit: Payer: Self-pay | Admitting: Internal Medicine

## 2018-09-14 ENCOUNTER — Ambulatory Visit: Payer: Medicare HMO

## 2018-09-14 ENCOUNTER — Other Ambulatory Visit: Payer: Self-pay

## 2018-09-14 VITALS — Ht 72.0 in | Wt 185.0 lb

## 2018-09-14 DIAGNOSIS — Z86010 Personal history of colonic polyps: Secondary | ICD-10-CM

## 2018-09-14 DIAGNOSIS — Z800 Family history of malignant neoplasm of digestive organs: Secondary | ICD-10-CM

## 2018-09-14 MED ORDER — NA SULFATE-K SULFATE-MG SULF 17.5-3.13-1.6 GM/177ML PO SOLN: 1.0000 | Freq: Once | ORAL | 0 refills | Status: AC

## 2018-09-14 NOTE — Progress Notes (Signed)
Per pt, no allergies to soy or egg products.Pt not taking any weight loss meds or using  O2 at home. Pt denies sedation problems.  Pt refused emmi video.  The PV was done over the phone due to COVID-19.  I verified the pt's address and insurance with him. I reviewed prep instructions and medical history with the pt and will mail the paperwork to the pt today. The pt was informed to call our office with any questions or any changes prior to his procedure. The pt undersrood.

## 2018-09-24 ENCOUNTER — Encounter: Payer: Self-pay | Admitting: Gastroenterology

## 2018-09-30 ENCOUNTER — Telehealth: Payer: Self-pay | Admitting: Gastroenterology

## 2018-09-30 NOTE — Telephone Encounter (Signed)
Answered all questions from the patient about the prep.

## 2018-09-30 NOTE — Telephone Encounter (Signed)
Patient called said he has a question regarding her prep. His procedure is on 10-08-18

## 2018-10-06 ENCOUNTER — Telehealth: Payer: Self-pay | Admitting: *Deleted

## 2018-10-06 NOTE — Telephone Encounter (Signed)
Message left

## 2018-10-07 ENCOUNTER — Telehealth: Payer: Self-pay | Admitting: *Deleted

## 2018-10-07 NOTE — Telephone Encounter (Signed)

## 2018-10-08 ENCOUNTER — Encounter: Payer: Self-pay | Admitting: Gastroenterology

## 2018-10-08 ENCOUNTER — Ambulatory Visit (AMBULATORY_SURGERY_CENTER): Payer: Medicare HMO | Admitting: Gastroenterology

## 2018-10-08 ENCOUNTER — Other Ambulatory Visit: Payer: Self-pay

## 2018-10-08 VITALS — BP 132/73 | HR 49 | Temp 97.5°F | Resp 12 | Ht 72.0 in | Wt 195.0 lb

## 2018-10-08 DIAGNOSIS — K633 Ulcer of intestine: Secondary | ICD-10-CM

## 2018-10-08 DIAGNOSIS — K599 Functional intestinal disorder, unspecified: Secondary | ICD-10-CM | POA: Diagnosis not present

## 2018-10-08 DIAGNOSIS — Z1211 Encounter for screening for malignant neoplasm of colon: Secondary | ICD-10-CM

## 2018-10-08 DIAGNOSIS — Z800 Family history of malignant neoplasm of digestive organs: Secondary | ICD-10-CM | POA: Diagnosis not present

## 2018-10-08 DIAGNOSIS — Z1212 Encounter for screening for malignant neoplasm of rectum: Secondary | ICD-10-CM | POA: Diagnosis not present

## 2018-10-08 DIAGNOSIS — L539 Erythematous condition, unspecified: Secondary | ICD-10-CM

## 2018-10-08 DIAGNOSIS — G4733 Obstructive sleep apnea (adult) (pediatric): Secondary | ICD-10-CM | POA: Diagnosis not present

## 2018-10-08 MED ORDER — SODIUM CHLORIDE 0.9 % IV SOLN: 500.0000 mL | Freq: Once | INTRAVENOUS | Status: DC

## 2018-10-08 NOTE — Progress Notes (Signed)
Report to PACU, RN, vss, BBS= Clear.  

## 2018-10-08 NOTE — Op Note (Signed)
[facility] Patient Name: Danny Young Procedure Date: 10/08/2018 9:16 AM MRN: [id number] Endoscopist: Ladene Artist , MD Age: 68 Referring MD:  Date of Birth: 1951-01-26 Gender: Male Account #: [id number] Procedure:                Colonoscopy Indications:              Screening for colorectal malignant neoplasm Medicines:                Monitored Anesthesia Care Procedure:                Pre-Anesthesia Assessment:                           - Prior to the procedure, a History and Physical                            was performed, and patient medications and                            allergies were reviewed. The patient's tolerance of                            previous anesthesia was also reviewed. The risks                            and benefits of the procedure and the sedation                            options and risks were discussed with the patient.                            All questions were answered, and informed consent                            was obtained. Prior Anticoagulants: The patient has                            taken no previous anticoagulant or antiplatelet                            agents. ASA Grade Assessment: II - A patient with                            mild systemic disease. After reviewing the risks                            and benefits, the patient was deemed in                            satisfactory condition to undergo the procedure.                           After obtaining informed consent, the colonoscope  was passed under direct vision. Throughout the                            procedure, the patient's blood pressure, pulse, and                            oxygen saturations were monitored continuously. The                            Colonoscope was introduced through the anus and                            advanced to the the cecum, identified by                            appendiceal orifice and  ileocecal valve. The                            ileocecal valve, appendiceal orifice, and rectum                            were photographed. The quality of the bowel                            preparation was excellent. The colonoscopy was                            performed without difficulty. The patient tolerated                            the procedure well. Scope In: 9:28:25 AM Scope Out: 9:39:27 AM Scope Withdrawal Time: 0 hours 9 minutes 22 seconds  Total Procedure Duration: 0 hours 11 minutes 2 seconds  Findings:                 The perianal and digital rectal examinations were                            normal.                           A single localized non-bleeding erosion was found                            in the descending colon. No stigmata of recent                            bleeding were seen. Biopsies were taken with a cold                            forceps for histology.                           Internal hemorrhoids were found during  retroflexion. The hemorrhoids were small and Grade                            I (internal hemorrhoids that do not prolapse).                           The exam was otherwise without abnormality on                            direct and retroflexion views. Complications:            No immediate complications. Estimated blood loss:                            None. Estimated Blood Loss:     Estimated blood loss: none. Impression:               - A single erosion in the descending colon.                            Biopsied.                           - Internal hemorrhoids.                           - The examination was otherwise normal on direct                            and retroflexion views. Recommendation:           - Repeat colonoscopy in 10 years for screening                            purposes.                           - Patient has a contact number available for                             emergencies. The signs and symptoms of potential                            delayed complications were discussed with the                            patient. Return to normal activities tomorrow.                            Written discharge instructions were provided to the                            patient.                           - Resume previous diet.                           -  Continue present medications.                           - Await pathology results. Ladene Artist, MD 10/08/2018 9:44:41 AM This report has been signed electronically.

## 2018-10-08 NOTE — Patient Instructions (Signed)
Handout given for hemorrhoids.\  YOU HAD AN ENDOSCOPIC PROCEDURE TODAY AT [facility]:   Refer to the procedure report that was given to you for any specific questions about what was found during the examination.  If the procedure report does not answer your questions, please call your gastroenterologist to clarify.  If you requested that your care partner not be given the details of your procedure findings, then the procedure report has been included in a sealed envelope for you to review at your convenience later.  YOU SHOULD EXPECT: Some feelings of bloating in the abdomen. Passage of more gas than usual.  Walking can help get rid of the air that was put into your GI tract during the procedure and reduce the bloating. If you had a lower endoscopy (such as a colonoscopy or flexible sigmoidoscopy) you may notice spotting of blood in your stool or on the toilet paper. If you underwent a bowel prep for your procedure, you may not have a normal bowel movement for a few days.  Please Note:  You might notice some irritation and congestion in your nose or some drainage.  This is from the oxygen used during your procedure.  There is no need for concern and it should clear up in a day or so.  SYMPTOMS TO REPORT IMMEDIATELY:   Following lower endoscopy (colonoscopy or flexible sigmoidoscopy):  Excessive amounts of blood in the stool  Significant tenderness or worsening of abdominal pains  Swelling of the abdomen that is new, acute  Fever of 100F or higher   For urgent or emergent issues, a gastroenterologist can be reached at any hour by calling [phone].   DIET:  We do recommend a small meal at first, but then you may proceed to your regular diet.  Drink plenty of fluids but you should avoid alcoholic beverages for 24 hours.  ACTIVITY:  You should plan to take it easy for the rest of today and you should NOT DRIVE or use heavy machinery until tomorrow (because of the sedation  medicines used during the test).    FOLLOW UP: Our staff will call the number listed on your records 48-72 hours following your procedure to check on you and address any questions or concerns that you may have regarding the information given to you following your procedure. If we do not reach you, we will leave a message.  We will attempt to reach you two times.  During this call, we will ask if you have developed any symptoms of COVID 19. If you develop any symptoms (ie: fever, flu-like symptoms, shortness of breath, cough etc.) before then, please call [phone].  If you test positive for Covid 19 in the 2 weeks post procedure, please call and report this information to [place].    If any biopsies were taken you will be contacted by phone or by letter within the next 1-3 weeks.  Please call us at [phone] if you have not heard about the biopsies in 3 weeks.    SIGNATURES/CONFIDENTIALITY: You and/or your care partner have signed paperwork which will be entered into your electronic medical record.  These signatures attest to the fact that that the information above on your After Visit Summary has been reviewed and is understood.  Full responsibility of the confidentiality of this discharge information lies with you and/or your care-partner.

## 2018-10-08 NOTE — Progress Notes (Signed)
Pt's states no medical or surgical changes since previsit or office visit. 

## 2018-10-08 NOTE — Progress Notes (Signed)
Called to room to assist during endoscopic procedure.  Patient ID and intended procedure confirmed with present staff. Received instructions for my participation in the procedure from the performing physician.  

## 2018-10-12 ENCOUNTER — Other Ambulatory Visit: Payer: Self-pay | Admitting: Internal Medicine

## 2018-10-12 ENCOUNTER — Telehealth: Payer: Self-pay

## 2018-10-12 NOTE — Telephone Encounter (Signed)
  Follow up Call-  Call back number 10/08/2018  Post procedure Call Back phone  # [phone]  Permission to leave phone message Yes  Some recent data might be hidden     Patient questions:  Do you have a fever, pain , or abdominal swelling? No. Pain Score  0 *  Have you tolerated food without any problems? Yes.    Have you been able to return to your normal activities? Yes.    Do you have any questions about your discharge instructions: Diet   No. Medications  No. Follow up visit  No.  Do you have questions or concerns about your Care? No.  Actions: * If pain score is 4 or above: No action needed, pain <4. 1. Have you developed a fever since your procedure? no  2.   Have you had an respiratory symptoms (SOB or cough) since your procedure? no  3.   Have you tested positive for COVID 19 since your procedure no  4.   Have you had any family members/close contacts diagnosed with the COVID 19 since your procedure?  no   If yes to any of these questions please route to Joylene John, RN and Alphonsa Gin, Therapist, sports.

## 2018-10-27 ENCOUNTER — Encounter: Payer: Self-pay | Admitting: Gastroenterology

## 2018-11-22 ENCOUNTER — Other Ambulatory Visit: Payer: Self-pay | Admitting: Internal Medicine

## 2019-01-11 DIAGNOSIS — R69 Illness, unspecified: Secondary | ICD-10-CM | POA: Diagnosis not present

## 2019-02-14 ENCOUNTER — Other Ambulatory Visit: Payer: Self-pay | Admitting: Internal Medicine

## 2019-02-19 DIAGNOSIS — H5213 Myopia, bilateral: Secondary | ICD-10-CM | POA: Diagnosis not present

## 2019-02-19 DIAGNOSIS — H2513 Age-related nuclear cataract, bilateral: Secondary | ICD-10-CM | POA: Diagnosis not present

## 2019-02-23 DIAGNOSIS — L820 Inflamed seborrheic keratosis: Secondary | ICD-10-CM | POA: Diagnosis not present

## 2019-02-23 DIAGNOSIS — L814 Other melanin hyperpigmentation: Secondary | ICD-10-CM | POA: Diagnosis not present

## 2019-02-23 DIAGNOSIS — D485 Neoplasm of uncertain behavior of skin: Secondary | ICD-10-CM | POA: Diagnosis not present

## 2019-02-23 DIAGNOSIS — L570 Actinic keratosis: Secondary | ICD-10-CM | POA: Diagnosis not present

## 2019-02-23 DIAGNOSIS — L821 Other seborrheic keratosis: Secondary | ICD-10-CM | POA: Diagnosis not present

## 2019-03-01 ENCOUNTER — Other Ambulatory Visit: Payer: Self-pay | Admitting: Internal Medicine

## 2019-04-16 DIAGNOSIS — Z7982 Long term (current) use of aspirin: Secondary | ICD-10-CM | POA: Diagnosis not present

## 2019-04-16 DIAGNOSIS — R062 Wheezing: Secondary | ICD-10-CM | POA: Diagnosis not present

## 2019-04-16 DIAGNOSIS — Z809 Family history of malignant neoplasm, unspecified: Secondary | ICD-10-CM | POA: Diagnosis not present

## 2019-04-16 DIAGNOSIS — Z91013 Allergy to seafood: Secondary | ICD-10-CM | POA: Diagnosis not present

## 2019-04-16 DIAGNOSIS — E785 Hyperlipidemia, unspecified: Secondary | ICD-10-CM | POA: Diagnosis not present

## 2019-04-16 DIAGNOSIS — J309 Allergic rhinitis, unspecified: Secondary | ICD-10-CM | POA: Diagnosis not present

## 2019-04-16 DIAGNOSIS — Z8249 Family history of ischemic heart disease and other diseases of the circulatory system: Secondary | ICD-10-CM | POA: Diagnosis not present

## 2019-07-08 DIAGNOSIS — R69 Illness, unspecified: Secondary | ICD-10-CM | POA: Diagnosis not present

## 2019-08-04 DIAGNOSIS — R69 Illness, unspecified: Secondary | ICD-10-CM | POA: Diagnosis not present

## 2019-08-24 ENCOUNTER — Encounter: Payer: Self-pay | Admitting: Internal Medicine

## 2019-08-24 ENCOUNTER — Other Ambulatory Visit: Payer: Self-pay

## 2019-08-24 ENCOUNTER — Ambulatory Visit (INDEPENDENT_AMBULATORY_CARE_PROVIDER_SITE_OTHER): Payer: Medicare HMO | Admitting: Internal Medicine

## 2019-08-24 VITALS — BP 140/76 | HR 59 | Temp 98.5°F | Ht 72.0 in | Wt 167.0 lb

## 2019-08-24 DIAGNOSIS — Z0000 Encounter for general adult medical examination without abnormal findings: Secondary | ICD-10-CM | POA: Diagnosis not present

## 2019-08-24 DIAGNOSIS — Z0001 Encounter for general adult medical examination with abnormal findings: Secondary | ICD-10-CM | POA: Diagnosis not present

## 2019-08-24 DIAGNOSIS — E559 Vitamin D deficiency, unspecified: Secondary | ICD-10-CM

## 2019-08-24 DIAGNOSIS — Z23 Encounter for immunization: Secondary | ICD-10-CM | POA: Diagnosis not present

## 2019-08-24 DIAGNOSIS — E538 Deficiency of other specified B group vitamins: Secondary | ICD-10-CM | POA: Diagnosis not present

## 2019-08-24 DIAGNOSIS — E611 Iron deficiency: Secondary | ICD-10-CM | POA: Diagnosis not present

## 2019-08-24 DIAGNOSIS — R739 Hyperglycemia, unspecified: Secondary | ICD-10-CM | POA: Diagnosis not present

## 2019-08-24 DIAGNOSIS — M25511 Pain in right shoulder: Secondary | ICD-10-CM

## 2019-08-24 DIAGNOSIS — R251 Tremor, unspecified: Secondary | ICD-10-CM

## 2019-08-24 DIAGNOSIS — E785 Hyperlipidemia, unspecified: Secondary | ICD-10-CM

## 2019-08-24 LAB — URINALYSIS, ROUTINE W REFLEX MICROSCOPIC
Bilirubin Urine: NEGATIVE
Hgb urine dipstick: NEGATIVE
Ketones, ur: NEGATIVE
Leukocytes,Ua: NEGATIVE
Nitrite: NEGATIVE
RBC / HPF: NONE SEEN (ref 0–?)
Specific Gravity, Urine: >=1.03 — AB (ref 1.000–1.030)
Total Protein, Urine: NEGATIVE
Urine Glucose: NEGATIVE
Urobilinogen, UA: 0.2 (ref 0.0–1.0)
pH: 5.5 (ref 5.0–8.0)

## 2019-08-24 LAB — CBC WITH DIFFERENTIAL/PLATELET
Basophils Absolute: 0 10*3/uL (ref 0.0–0.1)
Basophils Relative: 0.6 % (ref 0.0–3.0)
Eosinophils Absolute: 0.1 10*3/uL (ref 0.0–0.7)
Eosinophils Relative: 1.3 % (ref 0.0–5.0)
HCT: 48.3 % (ref 39.0–52.0)
Hemoglobin: 16.3 g/dL (ref 13.0–17.0)
Lymphocytes Relative: 25.3 % (ref 12.0–46.0)
Lymphs Abs: 1.6 10*3/uL (ref 0.7–4.0)
MCHC: 33.6 g/dL (ref 30.0–36.0)
MCV: 96.1 fl (ref 78.0–100.0)
Monocytes Absolute: 0.4 10*3/uL (ref 0.1–1.0)
Monocytes Relative: 6.5 % (ref 3.0–12.0)
Neutro Abs: 4.1 10*3/uL (ref 1.4–7.7)
Neutrophils Relative %: 66.3 % (ref 43.0–77.0)
Platelets: 240 10*3/uL (ref 150.0–400.0)
RBC: 5.02 Mil/uL (ref 4.22–5.81)
RDW: 12.6 % (ref 11.5–15.5)
WBC: 6.2 10*3/uL (ref 4.0–10.5)

## 2019-08-24 LAB — IBC PANEL
Iron: 91 ug/dL (ref 42–165)
Saturation Ratios: 22.8 % (ref 20.0–50.0)
Transferrin: 285 mg/dL (ref 212.0–360.0)

## 2019-08-24 LAB — HEMOGLOBIN A1C: Hgb A1c MFr Bld: 5.6 % (ref 4.6–6.5)

## 2019-08-24 LAB — HEPATIC FUNCTION PANEL
ALT: 19 U/L (ref 0–53)
AST: 17 U/L (ref 0–37)
Albumin: 4.9 g/dL (ref 3.5–5.2)
Alkaline Phosphatase: 66 U/L (ref 39–117)
Bilirubin, Direct: 0.2 mg/dL (ref 0.0–0.3)
Total Bilirubin: 0.8 mg/dL (ref 0.2–1.2)
Total Protein: 7.6 g/dL (ref 6.0–8.3)

## 2019-08-24 LAB — VITAMIN B12: Vitamin B-12: 310 pg/mL (ref 211–911)

## 2019-08-24 LAB — BASIC METABOLIC PANEL
BUN: 37 mg/dL — ABNORMAL HIGH (ref 6–23)
CO2: 29 mEq/L (ref 19–32)
Calcium: 9.7 mg/dL (ref 8.4–10.5)
Chloride: 103 mEq/L (ref 96–112)
Creatinine, Ser: 1.12 mg/dL (ref 0.40–1.50)
GFR: 65.22 mL/min (ref >60.00)
Glucose, Bld: 108 mg/dL — ABNORMAL HIGH (ref 70–99)
Potassium: 5.2 mEq/L — ABNORMAL HIGH (ref 3.5–5.1)
Sodium: 138 mEq/L (ref 135–145)

## 2019-08-24 LAB — TSH: TSH: 1.42 u[IU]/mL (ref 0.35–4.50)

## 2019-08-24 LAB — VITAMIN D 25 HYDROXY (VIT D DEFICIENCY, FRACTURES): VITD: 36.53 ng/mL (ref 30.00–100.00)

## 2019-08-24 LAB — LIPID PANEL
Cholesterol: 129 mg/dL (ref 0–200)
HDL: 33.8 mg/dL — ABNORMAL LOW (ref >39.00)
LDL Cholesterol: 82 mg/dL (ref 0–99)
NonHDL: 95.66
Total CHOL/HDL Ratio: 4
Triglycerides: 68 mg/dL (ref 0.0–149.0)
VLDL: 13.6 mg/dL (ref 0.0–40.0)

## 2019-08-24 LAB — PSA: PSA: 2.49 ng/mL (ref 0.10–4.00)

## 2019-08-24 MED ORDER — SIMVASTATIN 40 MG PO TABS: ORAL_TABLET | ORAL | 3 refills | Status: DC

## 2019-08-24 NOTE — Patient Instructions (Addendum)
You had the Tdap and the Pneumovax 23 shots today  Please continue all other medications as before, and refills have been done if requested.  Please have the pharmacy call with any other refills you may need.  Please continue your efforts at being more active, low cholesterol diet, and weight control.  You are otherwise up to date with prevention measures today.  Please keep your appointments with your specialists as you may have planned  You will be contacted regarding the referral for: orthopedic and neurology  Please go to the LAB at the blood drawing area for the tests to be done  You will be contacted by phone if any changes need to be made immediately.  Otherwise, you will receive a letter about your results with an explanation, but please check with MyChart first.  Please remember to sign up for MyChart if you have not done so, as this will be important to you in the future with finding out test results, communicating by private email, and scheduling acute appointments online when needed.  Please make an Appointment to return for your 1 year visit, or sooner if needed

## 2019-08-24 NOTE — Assessment & Plan Note (Signed)
Taking statin, cont low chol diet though I suspect may be less effective lately with following the keto diet for wt loss

## 2019-08-24 NOTE — Assessment & Plan Note (Signed)
Pt requests neurology referral, consider BB and/or MRI head; for b12 with labs

## 2019-08-24 NOTE — Assessment & Plan Note (Signed)
stable overall by history and exam, recent data reviewed with pt, and pt to continue medical treatment as before,  to f/u any worsening symptoms or concerns  

## 2019-08-24 NOTE — Assessment & Plan Note (Signed)
For ortho referral

## 2019-08-24 NOTE — Assessment & Plan Note (Signed)

## 2019-08-24 NOTE — Progress Notes (Signed)
Subjective:    Patient ID: Danny Young, male    DOB: 10-Mar-1951, 69 y.o.   MRN: [id number]  HPI   Here for wellness and f/u;  Overall doing ok;  Pt denies Chest pain, worsening SOB, DOE, wheezing, orthopnea, PND, worsening LE edema, palpitations, dizziness or syncope.  Pt denies neurological change such as new headache, facial or extremity weakness.  Pt denies polydipsia, polyuria, or low sugar symptoms. Pt states overall good compliance with treatment and medications, good tolerability, and has been trying to follow appropriate diet.  Pt denies worsening depressive symptoms, suicidal ideation or panic. No fever, night sweats, wt loss, loss of appetite, or other constitutional symptoms.  Pt states good ability with ADL's, has low fall risk, home safety reviewed and adequate, no other significant changes in hearing or vision, and only occasionally active with exercise.  Also with increasing LUE tremor, also with worsening right rot cuff pain and reduced rOM  Wt Readings from Last 3 Encounters:  08/24/19 190 lb (86.2 kg)  10/08/18 195 lb (88.5 kg)  09/14/18 185 lb (83.9 kg)   Past Medical History:  Diagnosis Date  . ALLERGIC RHINITIS 01/19/2007   Qualifier: Diagnosis of  By: Jenny Reichmann MD, Hunt Oris   . Allergy    seasonal  . Cataract   . 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 GERD 01/19/2007   Qualifier: Diagnosis of  By: Jenny Reichmann MD, Hunt Oris   . HYPERLIPIDEMIA 01/19/2007   Qualifier: Diagnosis of  By: Jenny Reichmann MD, Hunt Oris   . MIGRAINE HEADACHE 01/19/2007   Qualifier: Diagnosis of  By: Jenny Reichmann MD, Hunt Oris   . Sleep apnea    uses c-pap  . 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 COLONOSCOPY    . rotater cuff     left    reports that he has never smoked. He has never used smokeless tobacco. He reports current alcohol use. He reports that he does not use drugs. family history includes  Hypertension in his father and mother. No Known Allergies Current Outpatient Medications on File Prior to Visit  Medication Sig Dispense Refill  . albuterol (PROVENTIL HFA;VENTOLIN HFA) 108 (90 Base) MCG/ACT inhaler Inhale 2 puffs into the lungs every 6 (six) hours as needed for wheezing or shortness of breath. 1 Inhaler 11  . aspirin 81 MG chewable tablet Chew by mouth.    . fexofenadine (ALLEGRA) 180 MG tablet TAKE 1 TABLET BY MOUTH EVERY DAY 30 tablet 2  . triamcinolone (NASACORT) 55 MCG/ACT AERO nasal inhaler Place 2 sprays into the nose daily. 1 Inhaler 12   No current facility-administered medications on file prior to visit.   Review of Systems All otherwise neg per pt     Objective:   Physical Exam BP 140/76 (BP Location: Left Arm, Patient Position: Sitting, Cuff Size: Small)   Pulse (!) 59   Temp 98.5 F (36.9 C) (Oral)   Ht 6' (1.829 m)   Wt 190 lb (86.2 kg)   SpO2 97%   BMI 25.77 kg/m  VS noted,  Constitutional: Pt appears in NAD HENT: Head: NCAT.  Right Ear: External ear normal.  Left Ear: External ear normal.  Eyes: . Pupils are equal, round, and reactive to light. Conjunctivae and EOM are normal Nose: without d/c or deformity Neck: Neck supple. Gross normal ROM Cardiovascular: Normal rate and regular rhythm.   Pulmonary/Chest: Effort normal  and breath sounds without rales or wheezing.  Abd:  Soft, NT, ND, + BS, no organomegaly Neurological: Pt is alert. At baseline orientation, motor grossly intact Skin: Skin is warm. No rashes, other new lesions, no LE edema Psychiatric: Pt behavior is normal without agitation  All otherwise neg per pt Lab Results  Component Value Date   WBC 6.2 08/24/2019   HGB 16.3 08/24/2019   HCT 48.3 08/24/2019   PLT 240.0 08/24/2019   GLUCOSE 108 (H) 08/24/2019   CHOL 129 08/24/2019   TRIG 68.0 08/24/2019   HDL 33.80 (L) 08/24/2019   LDLDIRECT 84.1 01/06/2007   LDLCALC 82 08/24/2019   ALT 19 08/24/2019   AST 17 08/24/2019    NA 138 08/24/2019   K 5.2 (H) 08/24/2019   CL 103 08/24/2019   CREATININE 1.12 08/24/2019   BUN 37 (H) 08/24/2019   CO2 29 08/24/2019   TSH 1.42 08/24/2019   PSA 2.49 08/24/2019   INR 1.0 ratio 07/13/2009   HGBA1C 5.6 08/24/2019      Assessment & Plan:

## 2019-08-31 ENCOUNTER — Encounter: Payer: Self-pay | Admitting: Neurology

## 2019-08-31 ENCOUNTER — Other Ambulatory Visit: Payer: Self-pay

## 2019-08-31 ENCOUNTER — Ambulatory Visit: Payer: Medicare HMO | Admitting: Neurology

## 2019-08-31 VITALS — BP 148/72 | HR 59 | Temp 97.6°F | Ht 72.0 in | Wt 167.3 lb

## 2019-08-31 DIAGNOSIS — R251 Tremor, unspecified: Secondary | ICD-10-CM | POA: Diagnosis not present

## 2019-08-31 NOTE — Patient Instructions (Signed)
You have a rather mild and intermittent tremor of both hands.  I do not see any signs or symptoms of parkinson's like disease or what we call parkinsonism.   For your tremor, I would not recommend any new medication for fear of side effects (especially sleepiness and low blood pressure and pulse with a beta blocker for example) and in light of your mild tremor, I would favor observation.   We do not have to make a follow up appointment, but I would be happy to see you if need be.   Please remember, that any kind of tremor may be exacerbated by anxiety, anger, nervousness, excitement, dehydration, sleep deprivation, by caffeine, and low blood sugar values or blood sugar fluctuations and thyroid disease. Some medications can exacerbate tremors, this includes asthma medication, such as albuterol inhaler and certain antidepressant medications.  Please try to hydrate well with water, 6 to 8 cups/day are recommended, 8 ounces each.

## 2019-08-31 NOTE — Progress Notes (Signed)
Subjective:    Patient ID: DAYTRON GULAN is a 69 y.o. male.  HPI     Star Age, MD, PhD [facility] [address] [place], [place] [address] Dear Dr. Jenny Reichmann,   I saw your patient, Ilijah Frappier, upon your kind request to my neurologic clinic today for initial consultation of his tremor.  The patient is unaccompanied today.  As you know, Mr. Terracciano is a 69 year old left-handed or ambidextrous gentleman with an underlying medical history of hyperlipidemia, reflux disease, allergic rhinitis, WPW, status post ablation, sleep apnea, migraine headaches, and cervical degenerative disc disease, who reports an intermittent bilateral hand tremor, left side a little bit more noticeable than right for the past 18 months or so.  He has had some medication in the recent past that made his tremor worse.  He also had recent dental work done and felt his tremor was worse when he was taking pain medication which he no longer takes.  His tremor is not very bothersome but is noticeable when he holds something especially eating utensils or when he drinks and sometimes he has to support with other hand.  He has not had any changes in his balance or walking or posture, no sudden onset of one-sided weakness or numbness or tingling or droopy face or slurring of speech or recurrent headaches.  He has no family history of tremor or Parkinson's disease.  I reviewed your office note from 08/24/2019.  He had significant blood work on 08/24/2019 and I reviewed the results in epic.  Thyroid function was okay, A1c was okay and CMP, CBC and lipid profile were also good.  B12 was on the lower end of the spectrum. He writes with his left hand but plays ball with the right hand.  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  He does not necessarily hydrate very well with water, likes to drink Gatorade, diet and caffeine free Mountain Dew, decaf tea, and  half-and-half coffee.   His Past Medical History Is Significant For: Past Medical History:  Diagnosis Date  . ALLERGIC RHINITIS 01/19/2007   Qualifier: Diagnosis of  By: Jenny Reichmann MD, Hunt Oris   . Allergy    seasonal  . Cataract   . 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 GERD 01/19/2007   Qualifier: Diagnosis of  By: Jenny Reichmann MD, Hunt Oris   . HYPERLIPIDEMIA 01/19/2007   Qualifier: Diagnosis of  By: Jenny Reichmann MD, Hunt Oris   . MIGRAINE HEADACHE 01/19/2007   Qualifier: Diagnosis of  By: Jenny Reichmann MD, Hunt Oris   . Sleep apnea    uses c-pap  . 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 COLONOSCOPY    . rotater cuff     left    His Family History Is Significant For: Family History  Problem Relation Age of Onset  . Hypertension Mother   . Hypertension Father     His Social History Is Significant For: Social History   Socioeconomic History  . Marital status: Married    Spouse name: Not on file  . Number of children: Not on file  . Years of education: Not on file  . Highest education level: Not on file  Occupational History  . Not on file  Tobacco Use  . Smoking status: Never Smoker  .  Smokeless tobacco: Never Used  Substance and Sexual Activity  . Alcohol use: Yes    Comment: very occasional wine  . Drug use: No  . Sexual activity: Not on file  Other Topics Concern  . Not on file  Social History Narrative  . Not on file   Social Determinants of Health   Financial Resource Strain:   . Difficulty of Paying Living Expenses:   Food Insecurity:   . Worried About Charity fundraiser in the Last Year:   . Arboriculturist in the Last Year:   Transportation Needs:   . Film/video editor (Medical):   Marland Kitchen Lack of Transportation (Non-Medical):   Physical Activity:   . Days of Exercise per Week:   . Minutes of Exercise per Session:    Stress:   . Feeling of Stress :   Social Connections:   . Frequency of Communication with Friends and Family:   . Frequency of Social Gatherings with Friends and Family:   . Attends Religious Services:   . Active Member of Clubs or Organizations:   . Attends Archivist Meetings:   Marland Kitchen Marital Status:     His Allergies Are:  No Known Allergies:   His Current Medications Are:  Outpatient Encounter Medications as of 08/31/2019  Medication Sig  . albuterol (PROVENTIL HFA;VENTOLIN HFA) 108 (90 Base) MCG/ACT inhaler Inhale 2 puffs into the lungs every 6 (six) hours as needed for wheezing or shortness of breath.  . Ascorbic Acid (VITAMIN C) 500 MG CAPS Take by mouth.  Marland Kitchen aspirin 81 MG chewable tablet Chew by mouth.  . fexofenadine (ALLEGRA) 180 MG tablet TAKE 1 TABLET BY MOUTH EVERY DAY  . Multiple Vitamin (MULTIVITAMIN) capsule Take 1 capsule by mouth daily.  . simvastatin (ZOCOR) 40 MG tablet TAKE 1 TABLET BY MOUTH EVERYDAY AT BEDTIME  . triamcinolone (NASACORT) 55 MCG/ACT AERO nasal inhaler Place 2 sprays into the nose daily.   No facility-administered encounter medications on file as of 08/31/2019.  : Review of Systems:  Out of a complete 14 point review of systems, all are reviewed and negative with the exception of these symptoms as listed below:  Review of Systems  Neurological:       Here for consult on worsening bilateral hand tremors. Reports sx have been present for a while but are more noticeable and happening on a daily basis.    Objective:  Neurological Exam  Physical Exam Physical Examination:   Vitals:   08/31/19 1025  BP: (!) 148/72  Pulse: (!) 59  Temp: 97.6 F (36.4 C)    General Examination: The patient is a very pleasant 69 y.o. male in no acute distress. He appears well-developed and well-nourished and well groomed.   HEENT: Normocephalic, atraumatic, pupils are equal, round and reactive to light and accommodation. Funduscopic exam is normal with  sharp disc margins noted. Extraocular tracking is good without limitation to gaze excursion or nystagmus noted. Normal smooth pursuit is noted. Hearing is grossly intact. Face is symmetric with normal facial animation and normal facial sensation. Speech is clear with no dysarthria noted. There is no hypophonia. There is no lip, neck/head, jaw or voice tremor. Neck is supple with full range of passive and active motion. There are no carotid bruits on auscultation. Oropharynx exam reveals: moderate mouth dryness, adequate dental hygiene. Tongue protrudes centrally and palate elevates symmetrically.   Chest: Clear to auscultation without wheezing, rhonchi or crackles noted.  Heart: S1+S2+0, regular and  normal without murmurs, rubs or gallops noted.   Abdomen: Soft, non-tender and non-distended with normal bowel sounds appreciated on auscultation.  Extremities: There is no pitting edema in the distal lower extremities bilaterally. Pedal pulses are intact.  Skin: Warm and dry without trophic changes noted.  Musculoskeletal: exam reveals no obvious joint deformities, tenderness or joint swelling or erythema.   Neurologically:  Mental status: The patient is awake, alert and oriented in all 4 spheres. His immediate and remote memory, attention, language skills and fund of knowledge are appropriate. There is no evidence of aphasia, agnosia, apraxia or anomia. Speech is clear with normal prosody and enunciation. Thought process is linear. Mood is normal and affect is normal.  Cranial nerves II - XII are as described above under HEENT exam. In addition: shoulder shrug is normal with equal shoulder height noted. Motor exam: Normal bulk, strength and tone is noted. There is no drift, resting tremor or rebound.  On 08/31/2019: On Archimedes spiral drawing he has no significant trembling with the right hand or left hand which is his dominant hand, handwriting with the left hand is legible, not particularly  tremulous, not micrographic. He has a very slight left hand postural tremor, no significant postural tremor in the right hand, very slight action tremor in both upper extremities, no intention tremor, no resting tremor. Romberg is negative with the exception of slight sway. Reflexes are 1+ in the UEs and knees, trace in the ankles. Babinski: Toes are flexor bilaterally. Fine motor skills and coordination: intact with normal finger taps, normal hand movements, normal rapid alternating patting, normal foot taps and normal foot agility.  Cerebellar testing: No dysmetria or intention tremor on finger to nose testing. Heel to shin is unremarkable bilaterally. There is no truncal or gait ataxia.  Sensory exam: intact to light touch.  Gait, station and balance: He stands easily. No veering to one side is noted. No leaning to one side is noted. Posture is age-appropriate and stance is narrow based. Gait shows normal stride length and normal pace. No problems turning are noted.   Assessment and Plan:   In summary, Harrell Jerilynn Mages Blamer is a very pleasant 69 y.o.-year old male with an underlying medical history of hyperlipidemia, reflux disease, allergic rhinitis, WPW, status post ablation, sleep apnea, migraine headaches, and cervical degenerative disc disease, who presents for evaluation of his hand tremor.  He reports an intermittent hand tremor for the past year and a half approximately.  He has not had any consistent tremor and it is not particularly bothersome to him but it is noticeable when he tries to feed himself sometimes and when he tries to hold a drink.  He is not aware of any family history of tremor.  His history is not really compelling for essential tremor.  He has a very slight postural and action tremor today on exam, no signs of parkinsonism.  He is reassured in that regard.  I would not favor symptomatic medication for tremor control in his case.  A trial of beta-blocker could be considered but his  pulse rate is in the higher fifties and lower sixties typically and I would be cautious with that.  I advised him of symptomatic treatment options.  Mysoline can be considered as well but may cause side effects such as balance issues and sleepiness.  For now, he is agreeable to monitor his symptoms.  He is advised that his neurological exam otherwise is benign.  He is encouraged to stay well-hydrated with  water and limit his caffeine intake.  He typically drinks decaf tea and soda and only half-and-half coffee but does not drink a whole lot of water he admits.  We talked about potential tremor triggers including dehydration, sleep deprivation, stress, anxiety, certain medications.  He has not seen any significant connection between albuterol and his tremor.  He uses the albuterol very rarely in fact.  He did notice a connection between certain medications in the past and tremor exacerbation.  He is advised to follow-up on an as-needed basis.  I answered all his questions today and he was in agreement. Thank you very much for allowing me to participate in the care of this nice patient. If I can be of any further assistance to you please do not hesitate to call me at [phone].  Sincerely,   Star Age, MD, PhD

## 2019-09-07 DIAGNOSIS — M25512 Pain in left shoulder: Secondary | ICD-10-CM | POA: Diagnosis not present

## 2019-09-07 DIAGNOSIS — M25511 Pain in right shoulder: Secondary | ICD-10-CM | POA: Diagnosis not present

## 2019-09-07 DIAGNOSIS — M7541 Impingement syndrome of right shoulder: Secondary | ICD-10-CM | POA: Insufficient documentation

## 2019-09-15 ENCOUNTER — Telehealth (INDEPENDENT_AMBULATORY_CARE_PROVIDER_SITE_OTHER): Payer: Medicare HMO | Admitting: Internal Medicine

## 2019-09-15 DIAGNOSIS — J309 Allergic rhinitis, unspecified: Secondary | ICD-10-CM

## 2019-09-15 DIAGNOSIS — R05 Cough: Secondary | ICD-10-CM | POA: Diagnosis not present

## 2019-09-15 DIAGNOSIS — R739 Hyperglycemia, unspecified: Secondary | ICD-10-CM | POA: Diagnosis not present

## 2019-09-15 MED ORDER — AZITHROMYCIN 250 MG PO TABS
ORAL_TABLET | ORAL | 0 refills | Status: DC
Start: 2019-09-15 — End: 2019-12-06

## 2019-09-15 NOTE — Progress Notes (Signed)
Patient ID: Danny Young, male   DOB: 1951/04/24, 69 y.o.   MRN: [id number]  Virtual Visit via Video Note  I connected with Danny Young on 09/15/19 at 10:00 AM EDT by a video enabled telemedicine application and verified that I am speaking with the correct person using two identifiers.  Location: of all participants today Patient: at home Provider: at office   I discussed the limitations of evaluation and management by telemedicine and the availability of in person appointments. The patient expressed understanding and agreed to proceed.  History of Present Illness: Here with acute onset mild to mod 2-3 days ST, HA, general weakness and malaise, with prod cough greenish sputum, but Pt denies chest pain, increased sob or doe, wheezing, orthopnea, PND, increased LE swelling, palpitations, dizziness or syncope.  Does have several wks ongoing nasal allergy symptoms with clearish congestion, itch and sneezing, without fever, pain, ST, cough, swelling or wheezing.  Pt denies new neurological symptoms such as new headache, or facial or extremity weakness or numbness   Pt denies polydipsia, polyuria Past Medical History:  Diagnosis Date  . ALLERGIC RHINITIS 01/19/2007   Qualifier: Diagnosis of  By: Jenny Reichmann MD, Hunt Oris   . Allergy    seasonal  . Cataract   . 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 GERD 01/19/2007   Qualifier: Diagnosis of  By: Jenny Reichmann MD, Hunt Oris   . HYPERLIPIDEMIA 01/19/2007   Qualifier: Diagnosis of  By: Jenny Reichmann MD, Hunt Oris   . MIGRAINE HEADACHE 01/19/2007   Qualifier: Diagnosis of  By: Jenny Reichmann MD, Hunt Oris   . Sleep apnea    uses c-pap  . 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 COLONOSCOPY    . rotater cuff     left    reports that he has never smoked. He has never used smokeless tobacco. He reports current alcohol use. He reports that he does not use drugs. family  history includes Hypertension in his father and mother. No Known Allergies Current Outpatient Medications on File Prior to Visit  Medication Sig Dispense Refill  . albuterol (PROVENTIL HFA;VENTOLIN HFA) 108 (90 Base) MCG/ACT inhaler Inhale 2 puffs into the lungs every 6 (six) hours as needed for wheezing or shortness of breath. 1 Inhaler 11  . Ascorbic Acid (VITAMIN C) 500 MG CAPS Take by mouth.    Marland Kitchen aspirin 81 MG chewable tablet Chew by mouth.    . fexofenadine (ALLEGRA) 180 MG tablet TAKE 1 TABLET BY MOUTH EVERY DAY 30 tablet 2  . Multiple Vitamin (MULTIVITAMIN) capsule Take 1 capsule by mouth daily.    . simvastatin (ZOCOR) 40 MG tablet TAKE 1 TABLET BY MOUTH EVERYDAY AT BEDTIME 90 tablet 3  . triamcinolone (NASACORT) 55 MCG/ACT AERO nasal inhaler Place 2 sprays into the nose daily. 1 Inhaler 12   No current facility-administered medications on file prior to visit.    Observations/Objective: Alert, NAD, appropriate mood and affect, resps normal, cn 2-12 intact, moves all 4s, no visible rash or swelling Lab Results  Component Value Date   WBC 6.2 08/24/2019   HGB 16.3 08/24/2019   HCT 48.3 08/24/2019   PLT 240.0 08/24/2019   GLUCOSE 108 (H) 08/24/2019   CHOL 129 08/24/2019   TRIG 68.0 08/24/2019   HDL 33.80 (L) 08/24/2019   LDLDIRECT 84.1 01/06/2007   LDLCALC 82 08/24/2019   ALT 19 08/24/2019   AST  17 08/24/2019   NA 138 08/24/2019   K 5.2 (H) 08/24/2019   CL 103 08/24/2019   CREATININE 1.12 08/24/2019   BUN 37 (H) 08/24/2019   CO2 29 08/24/2019   TSH 1.42 08/24/2019   PSA 2.49 08/24/2019   INR 1.0 ratio 07/13/2009   HGBA1C 5.6 08/24/2019   Assessment and Plan: See notes  Follow Up Instructions: See notes   I discussed the assessment and treatment plan with the patient. The patient was provided an opportunity to ask questions and all were answered. The patient agreed with the plan and demonstrated an understanding of the instructions.   The patient was advised to  call back or seek an in-person evaluation if the symptoms worsen or if the condition fails to improve as anticipated.  Danny Cower, MD

## 2019-09-18 ENCOUNTER — Encounter: Payer: Self-pay | Admitting: Internal Medicine

## 2019-09-18 NOTE — Assessment & Plan Note (Signed)
stable overall by history and exam, recent data reviewed with pt, and pt to continue medical treatment as before,  to f/u any worsening symptoms or concerns  

## 2019-09-18 NOTE — Assessment & Plan Note (Signed)
Mild to mod, for restart nasal steroid,,  to f/u any worsening symptoms or concerns

## 2019-09-18 NOTE — Patient Instructions (Signed)
Please take all new medication as prescribed 

## 2019-09-18 NOTE — Assessment & Plan Note (Addendum)
Mild to mod, c/w bronchitis vs pna,  for antibx course,  to f/u any worsening symptoms or concerns  I spent 31 minutes in preparing to see the patient by review of recent labs, imaging and procedures, obtaining and reviewing separately obtained history, communicating with the patient and family or caregiver, ordering medications, tests or procedures, and documenting clinical information in the EHR including the differential Dx, treatment, and any further evaluation and other management of cough, allergies, hyperglycemia

## 2019-10-25 ENCOUNTER — Encounter (HOSPITAL_COMMUNITY): Payer: Self-pay

## 2019-10-25 ENCOUNTER — Telehealth: Payer: Self-pay

## 2019-10-25 ENCOUNTER — Emergency Department (HOSPITAL_COMMUNITY): Payer: Medicare HMO

## 2019-10-25 ENCOUNTER — Emergency Department (HOSPITAL_COMMUNITY)
Admission: EM | Admit: 2019-10-25 | Discharge: 2019-10-25 | Disposition: A | Discharge status: Home / Self Care | Payer: Medicare HMO | Attending: Emergency Medicine | Admitting: Emergency Medicine

## 2019-10-25 ENCOUNTER — Other Ambulatory Visit: Payer: Self-pay

## 2019-10-25 DIAGNOSIS — R112 Nausea with vomiting, unspecified: Secondary | ICD-10-CM | POA: Insufficient documentation

## 2019-10-25 DIAGNOSIS — N200 Calculus of kidney: Secondary | ICD-10-CM | POA: Diagnosis not present

## 2019-10-25 DIAGNOSIS — N23 Unspecified renal colic: Secondary | ICD-10-CM | POA: Diagnosis not present

## 2019-10-25 DIAGNOSIS — N134 Hydroureter: Secondary | ICD-10-CM | POA: Insufficient documentation

## 2019-10-25 DIAGNOSIS — Z87442 Personal history of urinary calculi: Secondary | ICD-10-CM | POA: Insufficient documentation

## 2019-10-25 DIAGNOSIS — Z7982 Long term (current) use of aspirin: Secondary | ICD-10-CM | POA: Diagnosis not present

## 2019-10-25 DIAGNOSIS — Z79899 Other long term (current) drug therapy: Secondary | ICD-10-CM | POA: Insufficient documentation

## 2019-10-25 DIAGNOSIS — R1032 Left lower quadrant pain: Secondary | ICD-10-CM | POA: Diagnosis present

## 2019-10-25 LAB — URINALYSIS, ROUTINE W REFLEX MICROSCOPIC
Bilirubin Urine: NEGATIVE
Glucose, UA: NEGATIVE mg/dL
Hgb urine dipstick: NEGATIVE
Ketones, ur: NEGATIVE mg/dL
Leukocytes,Ua: NEGATIVE
Nitrite: NEGATIVE
Protein, ur: NEGATIVE mg/dL
Specific Gravity, Urine: 1.028 (ref 1.005–1.030)
pH: 5 (ref 5.0–8.0)

## 2019-10-25 LAB — BASIC METABOLIC PANEL
Anion gap: 10 (ref 5–15)
BUN: 30 mg/dL — ABNORMAL HIGH (ref 8–23)
CO2: 26 mmol/L (ref 22–32)
Calcium: 9.6 mg/dL (ref 8.9–10.3)
Chloride: 104 mmol/L (ref 98–111)
Creatinine, Ser: 1.48 mg/dL — ABNORMAL HIGH (ref 0.61–1.24)
GFR calc Af Amer: 56 mL/min — ABNORMAL LOW (ref >60)
GFR calc non Af Amer: 48 mL/min — ABNORMAL LOW (ref >60)
Glucose, Bld: 135 mg/dL — ABNORMAL HIGH (ref 70–99)
Potassium: 4.5 mmol/L (ref 3.5–5.1)
Sodium: 140 mmol/L (ref 135–145)

## 2019-10-25 LAB — CBC
HCT: 49.3 % (ref 39.0–52.0)
Hemoglobin: 16.1 g/dL (ref 13.0–17.0)
MCH: 31.8 pg (ref 26.0–34.0)
MCHC: 32.7 g/dL (ref 30.0–36.0)
MCV: 97.4 fL (ref 80.0–100.0)
Platelets: 205 10*3/uL (ref 150–400)
RBC: 5.06 MIL/uL (ref 4.22–5.81)
RDW: 12.2 % (ref 11.5–15.5)
WBC: 14.6 10*3/uL — ABNORMAL HIGH (ref 4.0–10.5)
nRBC: 0 % (ref 0.0–0.2)

## 2019-10-25 MED ORDER — TAMSULOSIN HCL 0.4 MG PO CAPS: 0.4000 mg | ORAL_CAPSULE | Freq: Every day | ORAL | 0 refills | Status: DC

## 2019-10-25 MED ORDER — ONDANSETRON 4 MG PO TBDP
4.0000 mg | ORAL_TABLET | Freq: Three times a day (TID) | ORAL | 0 refills | Status: DC | PRN
Start: 2019-10-25 — End: 2020-07-07

## 2019-10-25 MED ORDER — SODIUM CHLORIDE 0.9 % IV BOLUS
1000.0000 mL | Freq: Once | INTRAVENOUS | Status: AC
  Administered 2019-10-25: 1000 mL via INTRAVENOUS

## 2019-10-25 MED ORDER — ONDANSETRON 4 MG PO TBDP
4.0000 mg | ORAL_TABLET | Freq: Once | ORAL | Status: AC | PRN
  Administered 2019-10-25: 4 mg via ORAL
  Filled 2019-10-25: qty 1

## 2019-10-25 MED ORDER — OXYCODONE-ACETAMINOPHEN 5-325 MG PO TABS
1.0000 | ORAL_TABLET | Freq: Once | ORAL | Status: AC
  Administered 2019-10-25: 1 via ORAL
  Filled 2019-10-25: qty 1

## 2019-10-25 MED ORDER — KETOROLAC TROMETHAMINE 15 MG/ML IJ SOLN
15.0000 mg | Freq: Once | INTRAMUSCULAR | Status: AC
  Administered 2019-10-25: 15 mg via INTRAVENOUS
  Filled 2019-10-25: qty 1

## 2019-10-25 MED ORDER — HYDROCODONE-ACETAMINOPHEN 5-325 MG PO TABS: 1.0000 | ORAL_TABLET | Freq: Four times a day (QID) | ORAL | 0 refills | Status: DC | PRN

## 2019-10-25 NOTE — Discharge Instructions (Signed)
  Kidney Stone There is evidence indicating that your kidney stone may have already passed.  For any continued pain, refer to the advice below. Hydration: Hydration is key to helping a kidney stone pass.  Have a goal of half a liter of water every hour or two. Antiinflammatory medications: Take 600 mg of ibuprofen every 6 hours or 440 mg (over the counter dose) to 500 mg (prescription dose) of naproxen every 12 hours for the next 3 days. After this time, these medications may be used as needed for pain. Take these medications with food to avoid upset stomach. Choose only one of these medications, do not take them together. Acetaminophen: Should you continue to have additional pain while taking the ibuprofen or naproxen, you may add in acetaminophen (generic for Tylenol) as needed. Your daily total maximum amount of acetaminophen from all sources should be limited to 4000mg /day for persons without liver problems, or 2000mg /day for those with liver problems. Vicodin: May take Vicodin (hydrocodone-acetaminophen) as needed for severe pain.   Do not drive or perform other dangerous activities while taking this medication as it can cause drowsiness as well as changes in reaction time and judgement.   Please note that each pill of Vicodin contains 325 mg of acetaminophen (generic for Tylenol) and the above dosage limits apply. Tamsulosin: This medication is designed to help the stone pass.  Take this medication daily until stone passes. Nausea/vomiting: Use the ondansetron (generic for Zofran) for nausea or vomiting.  This medication may not prevent all vomiting or nausea, but can help facilitate better hydration. Things that can help with nausea/vomiting also include peppermint/menthol candies, vitamin B12, and ginger. Follow-up: Follow-up with the urologist as soon as possible on this matter. Return: Return to the ED for significantly increased pain, difficulty urinating, pain with urination, fever,  uncontrolled vomiting, or any other major concerns.  For prescription assistance, may try using prescription discount sites or apps, such as goodrx.com or Good Rx smart phone app.

## 2019-10-25 NOTE — ED Triage Notes (Signed)
Pt presents w/Left flank pain, dysuria, nausea and vomiting since 0900. Pt reports hx of the same

## 2019-10-25 NOTE — Telephone Encounter (Signed)
Per [facility] [employer] on 10/25/2019 12:38:33 PM, Caller says that her husband has sharp pains on the side of his ribs and lower back radiating to his side bu tnot to his stomach. And he is vomiting. Just now started pain on his groin and a bit on his testicles. He had kidney stones many years ago. Last urine 15 min ago, but very little. Caller states spouse is having severe left flank pain. pain level 9. he is vomiting. also has pain in his groin and scrotum. He has urinated only a small amount. Temp 96.4  Advised to go to ED now. Patient currently in ED at [facility].

## 2019-10-25 NOTE — ED Provider Notes (Signed)
[facility] Provider Note   CSN: [id number] Arrival date & time: 10/25/19  1322     History Chief Complaint  Patient presents with  . Flank Pain    Danny Young is a 69 y.o. male.  HPI      Danny Young is a 69 y.o. male, with a history of kidney stone, hyperlipidemia, presenting to the ED with patient presents with left lower back pain beginning around 9 AM this morning. Pain is aching, waxing and waning, moderate to severe, radiating to the left flank and occasionally toward the left side of the groin.  Accompanied by nausea and vomiting.  He states he has had similar presentation with previous kidney stones. He states he perhaps has had some small amount of hematuria.  He is able to urinate, however, states it is a little more difficult. Denies fever/chills, abdominal pain, chest pain, shortness of breath, cough, diarrhea, groin swelling, dysuria, or any other complaints.  Past Medical History:  Diagnosis Date  . ALLERGIC RHINITIS 01/19/2007   Qualifier: Diagnosis of  By: Jenny Reichmann MD, Hunt Oris   . Allergy    seasonal  . Cataract   . 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 GERD 01/19/2007   Qualifier: Diagnosis of  By: Jenny Reichmann MD, Hunt Oris   . HYPERLIPIDEMIA 01/19/2007   Qualifier: Diagnosis of  By: Jenny Reichmann MD, Hunt Oris   . MIGRAINE HEADACHE 01/19/2007   Qualifier: Diagnosis of  By: Jenny Reichmann MD, Hunt Oris   . Sleep apnea    uses c-pap  . 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 Right shoulder pain 08/24/2019  . Cough 02/10/2018  . Wheezing 02/10/2018  . OSA (obstructive sleep apnea) 02/06/2017  . Hypersomnolence 12/26/2016  . Facial skin lesion 12/26/2016  . Hyperglycemia 12/26/2016  . Tremor 06/07/2015  . Pyrexia 06/07/2015  . Midline low back pain without sciatica 06/07/2015  . Plantar fasciitis of right foot 01/06/2013   . Preventative health care 10/22/2011  . 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 Hyperlipidemia 01/19/2007  . MIGRAINE HEADACHE 01/19/2007  . 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 Allergic rhinitis 01/19/2007  . GERD 01/19/2007    Past Surgical History:  Procedure Laterality Date  . COLONOSCOPY    . rotater cuff     left       Family History  Problem Relation Age of Onset  . Hypertension Mother   . Hypertension Father     Social History   Tobacco Use  . Smoking status: Never Smoker  . Smokeless tobacco: Never Used  Vaping Use  . Vaping Use: Never used  Substance Use Topics  . Alcohol use: Yes    Comment: very occasional wine  . Drug use: No    Home Medications Prior to Admission medications   Medication Sig Start Date End Date Taking? Authorizing Provider  Ascorbic Acid (VITAMIN C) 500 MG CAPS Take 1 tablet by mouth daily.    Yes [provider]  aspirin 81 MG chewable tablet Chew 81 mg by mouth daily.    Yes [provider]  fexofenadine (ALLEGRA) 180 MG tablet TAKE 1 TABLET BY MOUTH EVERY DAY Patient taking differently: Take 180 mg by mouth daily.  02/15/19  Yes Biagio Borg, MD  ibuprofen (ADVIL) 200 MG tablet Take 600 mg by mouth  every 6 (six) hours as needed for fever or headache.   Yes [provider]  Multiple Vitamin (MULTIVITAMIN) capsule Take 1 capsule by mouth daily.   Yes [provider]  simvastatin (ZOCOR) 40 MG tablet TAKE 1 TABLET BY MOUTH EVERYDAY AT BEDTIME Patient taking differently: Take 40 mg by mouth daily at 6 PM.  08/24/19  Yes Biagio Borg, MD  triamcinolone (NASACORT) 55 MCG/ACT AERO nasal inhaler Place 2 sprays into the nose daily. 07/14/18  Yes Biagio Borg, MD  albuterol (PROVENTIL HFA;VENTOLIN HFA) 108 (90 Base) MCG/ACT inhaler Inhale 2 puffs into the lungs every 6 (six) hours as needed for wheezing or shortness of breath. Patient not  taking: Reported on 10/25/2019 07/14/18   Biagio Borg, MD  azithromycin [facility]) 250 MG tablet 2 tab by mouth day 1, then 1 per day Patient not taking: Reported on 10/25/2019 09/15/19   Biagio Borg, MD  HYDROcodone-acetaminophen (NORCO/VICODIN) 5-325 MG tablet Take 1 tablet by mouth every 6 (six) hours as needed for severe pain. 10/25/19   Michayla Mcneil C, PA-C  ondansetron (ZOFRAN ODT) 4 MG disintegrating tablet Take 1 tablet (4 mg total) by mouth every 8 (eight) hours as needed for nausea or vomiting. 10/25/19   Brysten Reister C, PA-C  tamsulosin (FLOMAX) 0.4 MG CAPS capsule Take 1 capsule (0.4 mg total) by mouth daily. 10/25/19   Jahmari Esbenshade, Helane Gunther, PA-C    Allergies    Patient has no known allergies.  Review of Systems   Review of Systems  Constitutional: Negative for chills, diaphoresis and fever.  Respiratory: Negative for shortness of breath.   Cardiovascular: Negative for chest pain.  Gastrointestinal: Positive for nausea and vomiting. Negative for abdominal pain, blood in stool and diarrhea.  Genitourinary: Positive for flank pain and hematuria. Negative for dysuria, penile pain, scrotal swelling and testicular pain.  Musculoskeletal: Positive for back pain.  Neurological: Negative for weakness and numbness.  All other systems reviewed and are negative.   Physical Exam Updated Vital Signs BP (!) 146/65 (BP Location: Left Arm)   Pulse (!) 55   Temp (!) 97.5 F (36.4 C) (Oral)   Resp 16   SpO2 100%   Physical Exam Vitals and nursing note reviewed.  Constitutional:      General: He is not in acute distress.    Appearance: He is well-developed. He is not diaphoretic.  HENT:     Head: Normocephalic and atraumatic.     Mouth/Throat:     Mouth: Mucous membranes are moist.     Pharynx: Oropharynx is clear.  Eyes:     Conjunctiva/sclera: Conjunctivae normal.  Cardiovascular:     Rate and Rhythm: Normal rate and regular rhythm.     Pulses: Normal pulses.          Radial pulses are 2+  on the right side and 2+ on the left side.       Posterior tibial pulses are 2+ on the right side and 2+ on the left side.     Heart sounds: Normal heart sounds.     Comments: Tactile temperature in the extremities appropriate and equal bilaterally. Pulmonary:     Effort: Pulmonary effort is normal. No respiratory distress.     Breath sounds: Normal breath sounds.  Abdominal:     Palpations: Abdomen is soft.     Tenderness: There is no abdominal tenderness. There is no guarding.  Musculoskeletal:     Cervical back: Neck supple.  Back:     Right lower leg: No edema.     Left lower leg: No edema.  Lymphadenopathy:     Cervical: No cervical adenopathy.  Skin:    General: Skin is warm and dry.  Neurological:     Mental Status: He is alert.  Psychiatric:        Mood and Affect: Mood and affect normal.        Speech: Speech normal.        Behavior: Behavior normal.     ED Results / Procedures / Treatments   Labs (all labs ordered are listed, but only abnormal results are displayed) Labs Reviewed  URINALYSIS, ROUTINE W REFLEX MICROSCOPIC - Abnormal; Notable for the following components:      Result Value   APPearance HAZY (*)    All other components within normal limits  BASIC METABOLIC PANEL - Abnormal; Notable for the following components:   Glucose, Bld 135 (*)    BUN 30 (*)    Creatinine, Ser 1.48 (*)    GFR calc non Af Amer 48 (*)    GFR calc Af Amer 56 (*)    All other components within normal limits  CBC - Abnormal; Notable for the following components:   WBC 14.6 (*)    All other components within normal limits    BUN  Date Value Ref Range Status  10/25/2019 30 (H) 8 - 23 mg/dL Final  08/24/2019 37 (H) 6 - 23 mg/dL Final  02/10/2018 23 6 - 23 mg/dL Final  12/26/2016 24 (H) 6 - 23 mg/dL Final   Creatinine, Ser  Date Value Ref Range Status  10/25/2019 1.48 (H) 0.61 - 1.24 mg/dL Final  08/24/2019 1.12 0.40 - 1.50 mg/dL Final  02/10/2018 1.06 0.40 - 1.50  mg/dL Final  12/26/2016 1.16 0.40 - 1.50 mg/dL Final     EKG None  Radiology CT Renal Stone Study  Result Date: 10/25/2019 CLINICAL DATA:  Flank pain.  LEFT flank pain since 9 a.m. EXAM: CT ABDOMEN AND PELVIS WITHOUT CONTRAST TECHNIQUE: Multidetector CT imaging of the abdomen and pelvis was performed following the standard protocol without IV contrast. COMPARISON:  None. FINDINGS: Lower chest: Lung bases are clear. Hepatobiliary: No focal hepatic lesion. No biliary duct dilatation. Gallbladder is normal. Common bile duct is normal. Pancreas: Pancreas is normal. No ductal dilatation. No pancreatic inflammation. Spleen: Normal spleen Adrenals/urinary tract: Adrenal glands normal. Perinephric stranding about the LEFT kidney. Mild LEFT renal edema. There is mild hydroureter on the LEFT which extends to the LEFT vesicoureteral junction. No obstructing calculi is present within LEFT ureter. However, there is a dependent calculus within the bladder measuring 3 mm on image 80/3. This is presumed a passed LEFT ureteral stone. No additional LEFT or RIGHT renal calculi are present. Stomach/Bowel: Stomach, small bowel, appendix, and cecum are normal. The colon and rectosigmoid colon are normal. Vascular/Lymphatic: Abdominal aorta is normal caliber with atherosclerotic calcification. There is no retroperitoneal or periportal lymphadenopathy. No pelvic lymphadenopathy. Reproductive: Prostate unremarkable Other: No free fluid. Musculoskeletal: No aggressive osseous lesion. IMPRESSION: 1. LEFT renal edema, perinephric stranding and LEFT hydroureter are favored sequelae of a passed LEFT ureteral calculus. Dependent small calculus within the bladder presumed passed from the LEFT ureter. 2. No nephrolithiasis. 3.  Aortic Atherosclerosis (ICD10-I70.0). Electronically Signed   By: Suzy Bouchard M.D.   On: 10/25/2019 19:56    Procedures Procedures (including critical care time)  Medications Ordered in ED Medications   ondansetron (ZOFRAN-ODT) disintegrating tablet  4 mg (4 mg Oral Given 10/25/19 1401)  oxyCODONE-acetaminophen (PERCOCET/ROXICET) 5-325 MG per tablet 1 tablet (1 tablet Oral Given 10/25/19 1401)  sodium chloride 0.9 % bolus 1,000 mL (0 mLs Intravenous Stopped 10/25/19 2024)  ketorolac (TORADOL) 15 MG/ML injection 15 mg (15 mg Intravenous Given 10/25/19 1847)    ED Course  I have reviewed the triage vital signs and the nursing notes.  Pertinent labs & imaging results that were available during my care of the patient were reviewed by me and considered in my medical decision making (see chart for details).    MDM Rules/Calculators/A&P                          Patient presents with left lower back and flank pain.  Suspicion for renal colic. Patient is nontoxic appearing, afebrile, not tachycardic, not tachypneic, not hypotensive, maintains excellent SPO2 on room air.  I have reviewed the patient's chart to obtain more information.   I reviewed and interpreted the patient's labs and radiological studies. Slight increase in patient's creatinine.  Leukocytosis nonspecific. Relief of pain here in the ED.  Evidence of passed stone on CT renal study. The patient was given instructions for home care as well as return precautions. Patient voices understanding of these instructions, accepts the plan, and is comfortable with discharge.   Findings and plan of care discussed with Madalyn Rob, MD. Dr. Roslynn Amble personally evaluated and examined this patient.   Vitals:   10/25/19 1347 10/25/19 1614 10/25/19 1845 10/25/19 2029  BP: (!) 163/75 (!) 146/65 (!) 150/98 128/63  Pulse: 60 (!) 55 (!) 55 74  Resp: 18 16  16   Temp: (!) 97.5 F (36.4 C)   98.7 F (37.1 C)  TempSrc: Oral   Oral  SpO2: 99% 100% 100% 100%     Final Clinical Impression(s) / ED Diagnoses Final diagnoses:  Ureteral colic    Rx / DC Orders ED Discharge Orders         Ordered    ondansetron (ZOFRAN ODT) 4 MG disintegrating  tablet  Every 8 hours PRN     Discontinue  Reprint     10/25/19 2013    HYDROcodone-acetaminophen (NORCO/VICODIN) 5-325 MG tablet  Every 6 hours PRN     Discontinue  Reprint     10/25/19 2013    tamsulosin (FLOMAX) 0.4 MG CAPS capsule  Daily     Discontinue  Reprint     10/25/19 2013           Layla Maw 10/25/19 2048    Lucrezia Starch, MD 10/25/19 2130

## 2019-11-16 DIAGNOSIS — Z85828 Personal history of other malignant neoplasm of skin: Secondary | ICD-10-CM | POA: Diagnosis not present

## 2019-11-16 DIAGNOSIS — Z7982 Long term (current) use of aspirin: Secondary | ICD-10-CM | POA: Diagnosis not present

## 2019-11-16 DIAGNOSIS — Z8249 Family history of ischemic heart disease and other diseases of the circulatory system: Secondary | ICD-10-CM | POA: Diagnosis not present

## 2019-11-16 DIAGNOSIS — G4730 Sleep apnea, unspecified: Secondary | ICD-10-CM | POA: Diagnosis not present

## 2019-11-16 DIAGNOSIS — Z809 Family history of malignant neoplasm, unspecified: Secondary | ICD-10-CM | POA: Diagnosis not present

## 2019-11-16 DIAGNOSIS — E785 Hyperlipidemia, unspecified: Secondary | ICD-10-CM | POA: Diagnosis not present

## 2019-12-06 ENCOUNTER — Telehealth (INDEPENDENT_AMBULATORY_CARE_PROVIDER_SITE_OTHER): Payer: Medicare HMO | Admitting: Internal Medicine

## 2019-12-06 ENCOUNTER — Encounter: Payer: Self-pay | Admitting: Internal Medicine

## 2019-12-06 DIAGNOSIS — R05 Cough: Secondary | ICD-10-CM | POA: Diagnosis not present

## 2019-12-06 DIAGNOSIS — R059 Cough, unspecified: Secondary | ICD-10-CM

## 2019-12-06 MED ORDER — MONTELUKAST SODIUM 10 MG PO TABS
10.0000 mg | ORAL_TABLET | Freq: Every day | ORAL | 3 refills | Status: DC
Start: 2019-12-06 — End: 2020-04-05

## 2019-12-06 MED ORDER — DOXYCYCLINE HYCLATE 100 MG PO TABS
100.0000 mg | ORAL_TABLET | Freq: Two times a day (BID) | ORAL | 0 refills | Status: DC
Start: 2019-12-06 — End: 2020-07-07

## 2019-12-06 MED ORDER — PROMETHAZINE-DM 6.25-15 MG/5ML PO SYRP
5.0000 mL | ORAL_SOLUTION | Freq: Two times a day (BID) | ORAL | 0 refills | Status: DC | PRN
Start: 2019-12-06 — End: 2020-07-07

## 2019-12-06 NOTE — Progress Notes (Signed)
Virtual Visit via Audio Note  I connected with Danny Young on 12/06/19 at  3:40 PM EDT by an audio-only enabled telemedicine application and verified that I am speaking with the correct person using two identifiers.  The patient and the provider were at separate locations throughout the entire encounter. Patient location: home, Provider location: work   I discussed the limitations of evaluation and management by telemedicine and the availability of in person appointments. The patient expressed understanding and agreed to proceed. The patient and the provider were the only parties present for the visit unless noted in HPI below.  History of Present Illness: The patient is a 69 y.o. man with visit for sore throat. Started 1-2 weeks ago. Has congestion in sinuses and chest. Denies fevers or chills. Overall it is not improving. Has tried mucinex. Still taking allegra and nose spray which are not helping much.   Observations/Objective: Voice strong, A and O times 3, no coughing or dyspnea during visit  Assessment and Plan: See problem oriented charting  Follow Up Instructions: rx doxycycline, singulair and cough medicine  Visit time 11 minutes in face to face communication with patient and coordination of care  I discussed the assessment and treatment plan with the patient. The patient was provided an opportunity to ask questions and all were answered. The patient agreed with the plan and demonstrated an understanding of the instructions.   The patient was advised to call back or seek an in-person evaluation if the symptoms worsen or if the condition fails to improve as anticipated.  Hoyt Koch, MD

## 2019-12-07 NOTE — Assessment & Plan Note (Signed)
Rx doxycycline and singulair and promethazine/dm cough syrup.

## 2020-01-17 DIAGNOSIS — R69 Illness, unspecified: Secondary | ICD-10-CM | POA: Diagnosis not present

## 2020-01-27 DIAGNOSIS — M7541 Impingement syndrome of right shoulder: Secondary | ICD-10-CM | POA: Diagnosis not present

## 2020-01-27 DIAGNOSIS — M25511 Pain in right shoulder: Secondary | ICD-10-CM | POA: Diagnosis not present

## 2020-02-23 DIAGNOSIS — B356 Tinea cruris: Secondary | ICD-10-CM | POA: Diagnosis not present

## 2020-02-23 DIAGNOSIS — L821 Other seborrheic keratosis: Secondary | ICD-10-CM | POA: Diagnosis not present

## 2020-02-23 DIAGNOSIS — L570 Actinic keratosis: Secondary | ICD-10-CM | POA: Diagnosis not present

## 2020-02-23 DIAGNOSIS — B353 Tinea pedis: Secondary | ICD-10-CM | POA: Diagnosis not present

## 2020-03-30 DIAGNOSIS — H5213 Myopia, bilateral: Secondary | ICD-10-CM | POA: Diagnosis not present

## 2020-03-30 DIAGNOSIS — H25013 Cortical age-related cataract, bilateral: Secondary | ICD-10-CM | POA: Diagnosis not present

## 2020-04-05 ENCOUNTER — Other Ambulatory Visit: Payer: Self-pay | Admitting: Internal Medicine

## 2020-07-07 ENCOUNTER — Encounter: Payer: Self-pay | Admitting: Family Medicine

## 2020-07-07 ENCOUNTER — Telehealth (INDEPENDENT_AMBULATORY_CARE_PROVIDER_SITE_OTHER): Payer: Medicare HMO | Admitting: Family Medicine

## 2020-07-07 VITALS — Ht 72.0 in | Wt 167.0 lb

## 2020-07-07 DIAGNOSIS — J209 Acute bronchitis, unspecified: Secondary | ICD-10-CM

## 2020-07-07 NOTE — Progress Notes (Signed)
Patient ID: Danny Young, male   DOB: 01-21-1951, 70 y.o.   MRN: [id number]  This visit type was conducted due to national recommendations for restrictions regarding the COVID-19 pandemic in an effort to limit this patient's exposure and mitigate transmission in our community.   Virtual Visit via Video Note  I connected with Yolande Jolly on 07/07/20 at  9:00 AM EST by a video enabled telemedicine application and verified that I am speaking with the correct person using two identifiers.  Location patient: home Location provider:work or home office Persons participating in the virtual visit: patient, provider  I discussed the limitations of evaluation and management by telemedicine and the availability of in person appointments. The patient expressed understanding and agreed to proceed.   HPI: Mr. Belsito called with onset either Monday or Tuesday of this week of sore throat and nasal congestion followed by cough.  Low-grade fever 99.6.  Cough nonproductive.  He has taken some over-the-counter DayQuil.  At home rapid Covid test which came back negative.  No known sick contacts.  No dyspnea.  Not monitoring O2 sats.  No significant body aches.  Has been fully vaccinated for Covid.  Denies any nausea, vomiting, or diarrhea.  No history of COPD or chronic lung disease.   ROS: See pertinent positives and negatives per HPI.  Past Medical History:  Diagnosis Date  . ALLERGIC RHINITIS 01/19/2007   Qualifier: Diagnosis of  By: Jenny Reichmann MD, Hunt Oris   . Allergy    seasonal  . Cataract   . 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 GERD 01/19/2007   Qualifier: Diagnosis of  By: Jenny Reichmann MD, Hunt Oris   . HYPERLIPIDEMIA 01/19/2007   Qualifier: Diagnosis of  By: Jenny Reichmann MD, Hunt Oris   . MIGRAINE HEADACHE 01/19/2007   Qualifier: Diagnosis of  By: Jenny Reichmann MD, Hunt Oris   . Sleep apnea    uses c-pap  . 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 COLONOSCOPY    . rotater cuff     left    Family History  Problem Relation Age of Onset  . Hypertension Mother   . Hypertension Father     SOCIAL HX: Non-smoker   Current Outpatient Medications:  .  Ascorbic Acid (VITAMIN C) 500 MG CAPS, Take 1 tablet by mouth daily. , Disp: , Rfl:  .  aspirin 81 MG chewable tablet, Chew 81 mg by mouth daily. , Disp: , Rfl:  .  ibuprofen (ADVIL) 200 MG tablet, Take 600 mg by mouth every 6 (six) hours as needed for fever or headache., Disp: , Rfl:  .  montelukast (SINGULAIR) 10 MG tablet, TAKE 1 TABLET BY MOUTH EVERYDAY AT BEDTIME, Disp: 30 tablet, Rfl: 3 .  Multiple Vitamin (MULTIVITAMIN) capsule, Take 1 capsule by mouth daily., Disp: , Rfl:  .  simvastatin (ZOCOR) 40 MG tablet, TAKE 1 TABLET BY MOUTH EVERYDAY AT BEDTIME (Patient taking differently: Take 40 mg by mouth daily at 6 PM.), Disp: 90 tablet, Rfl: 3 .  triamcinolone (NASACORT) 55 MCG/ACT AERO nasal inhaler, Place 2 sprays into the nose daily., Disp: 1 Inhaler, Rfl: 12 .  albuterol (PROVENTIL HFA;VENTOLIN HFA) 108 (90 Base) MCG/ACT inhaler, Inhale 2 puffs into the lungs every 6 (six) hours as needed for wheezing or shortness of breath. (Patient not taking: No sig reported), Disp: 1 Inhaler, Rfl: 11  EXAM:  VITALS per patient if  applicable:  GENERAL: alert, oriented, appears well and in no acute distress  HEENT: atraumatic, conjunttiva clear, no obvious abnormalities on inspection of external nose and ears  NECK: normal movements of the head and neck  LUNGS: on inspection no signs of respiratory distress, breathing rate appears normal, no obvious gross SOB, gasping or wheezing  CV: no obvious cyanosis  MS: moves all visible extremities without noticeable abnormality  PSYCH/NEURO: pleasant and cooperative, no obvious depression or anxiety, speech and thought processing grossly intact  ASSESSMENT AND PLAN:  Discussed the following assessment  and plan:  Cough.  Suspect acute viral bronchitis.  Rapid Covid test negative.  We have recommend he monitor O2 sats if possible.  If he has any increased dyspnea or worsening symptoms recommend repeat PCR Covid test otherwise treat symptomatically with plenty fluids and consider over-the-counter plain Mucinex.     I discussed the assessment and treatment plan with the patient. The patient was provided an opportunity to ask questions and all were answered. The patient agreed with the plan and demonstrated an understanding of the instructions.   The patient was advised to call back or seek an in-person evaluation if the symptoms worsen or if the condition fails to improve as anticipated.     Carolann Littler, MD

## 2020-07-20 ENCOUNTER — Telehealth: Payer: Self-pay | Admitting: Internal Medicine

## 2020-07-20 ENCOUNTER — Telehealth (INDEPENDENT_AMBULATORY_CARE_PROVIDER_SITE_OTHER): Payer: Medicare HMO | Admitting: Internal Medicine

## 2020-07-20 DIAGNOSIS — E785 Hyperlipidemia, unspecified: Secondary | ICD-10-CM

## 2020-07-20 DIAGNOSIS — R739 Hyperglycemia, unspecified: Secondary | ICD-10-CM | POA: Diagnosis not present

## 2020-07-20 DIAGNOSIS — J329 Chronic sinusitis, unspecified: Secondary | ICD-10-CM | POA: Diagnosis not present

## 2020-07-20 DIAGNOSIS — J309 Allergic rhinitis, unspecified: Secondary | ICD-10-CM

## 2020-07-20 DIAGNOSIS — Z0000 Encounter for general adult medical examination without abnormal findings: Secondary | ICD-10-CM

## 2020-07-20 DIAGNOSIS — E559 Vitamin D deficiency, unspecified: Secondary | ICD-10-CM

## 2020-07-20 DIAGNOSIS — E538 Deficiency of other specified B group vitamins: Secondary | ICD-10-CM

## 2020-07-20 MED ORDER — LEVOFLOXACIN 500 MG PO TABS
500.0000 mg | ORAL_TABLET | Freq: Every day | ORAL | 0 refills | Status: AC
Start: 2020-07-20 — End: 2020-07-30

## 2020-07-20 MED ORDER — PREDNISONE 10 MG PO TABS: ORAL_TABLET | ORAL | 0 refills | Status: DC

## 2020-07-20 NOTE — Telephone Encounter (Signed)
Patient calling to report he does not wish to take Prednisone. He states last time he took medication it caused him to have slurred speech and weakness   Patient also requesting order for annual labs

## 2020-07-20 NOTE — Progress Notes (Signed)
Patient ID: Danny Young, male   DOB: 04-14-1951, 70 y.o.   MRN: [id number]  Virtual Visit via Video Note  I connected with Danny Young on 07/23/20 at 11:00 AM EDT by a video enabled telemedicine application and verified that I am speaking with the correct person using two identifiers.  Location of all participants today Patient: at home Provider: at office   I discussed the limitations of evaluation and management by telemedicine and the availability of in person appointments. The patient expressed understanding and agreed to proceed.  History of Present Illness:  Here with 2-3 days acute onset fever, facial pain, pressure, headache, general weakness and malaise, and greenish d/c, with mild ST and cough, but pt denies chest pain, wheezing, increased sob or doe, orthopnea, PND, increased LE swelling, palpitations, dizziness or syncope.  Does have several wks ongoing nasal allergy symptoms with clearish congestion, itch and sneezing, without fever, pain, ST, cough, swelling or wheezing.  Denies new worsening focal neuro s/s.   Pt denies polydipsia, polyuria,  No other new complaints Past Medical History:  Diagnosis Date  . ALLERGIC RHINITIS 01/19/2007   Qualifier: Diagnosis of  By: Jenny Reichmann MD, Hunt Oris   . Allergy    seasonal  . Cataract   . 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 GERD 01/19/2007   Qualifier: Diagnosis of  By: Jenny Reichmann MD, Hunt Oris   . HYPERLIPIDEMIA 01/19/2007   Qualifier: Diagnosis of  By: Jenny Reichmann MD, Hunt Oris   . MIGRAINE HEADACHE 01/19/2007   Qualifier: Diagnosis of  By: Jenny Reichmann MD, Hunt Oris   . Sleep apnea    uses c-pap  . 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 COLONOSCOPY    . rotater cuff     left    reports that he has never smoked. He has never used smokeless tobacco. He reports current alcohol use. He reports that he does not use drugs. family history  includes Hypertension in his father and mother. No Known Allergies Current Outpatient Medications on File Prior to Visit  Medication Sig Dispense Refill  . albuterol (PROVENTIL HFA;VENTOLIN HFA) 108 (90 Base) MCG/ACT inhaler Inhale 2 puffs into the lungs every 6 (six) hours as needed for wheezing or shortness of breath. (Patient not taking: No sig reported) 1 Inhaler 11  . Ascorbic Acid (VITAMIN C) 500 MG CAPS Take 1 tablet by mouth daily.     Marland Kitchen aspirin 81 MG chewable tablet Chew 81 mg by mouth daily.     Marland Kitchen ibuprofen (ADVIL) 200 MG tablet Take 600 mg by mouth every 6 (six) hours as needed for fever or headache.    . montelukast (SINGULAIR) 10 MG tablet TAKE 1 TABLET BY MOUTH EVERYDAY AT BEDTIME 30 tablet 3  . Multiple Vitamin (MULTIVITAMIN) capsule Take 1 capsule by mouth daily.    . simvastatin (ZOCOR) 40 MG tablet TAKE 1 TABLET BY MOUTH EVERYDAY AT BEDTIME (Patient taking differently: Take 40 mg by mouth daily at 6 PM.) 90 tablet 3  . triamcinolone (NASACORT) 55 MCG/ACT AERO nasal inhaler Place 2 sprays into the nose daily. 1 Inhaler 12   No current facility-administered medications on file prior to visit.    Observations/Objective: Alert, NAD, appropriate mood and affect, resps normal, cn 2-12 intact, moves all 4s, no visible rash or swelling Lab Results  Component Value Date   WBC 14.6 (H) 10/25/2019   HGB 16.1  10/25/2019   HCT 49.3 10/25/2019   PLT 205 10/25/2019   GLUCOSE 135 (H) 10/25/2019   CHOL 129 08/24/2019   TRIG 68.0 08/24/2019   HDL 33.80 (L) 08/24/2019   LDLDIRECT 84.1 01/06/2007   LDLCALC 82 08/24/2019   ALT 19 08/24/2019   AST 17 08/24/2019   NA 140 10/25/2019   K 4.5 10/25/2019   CL 104 10/25/2019   CREATININE 1.48 (H) 10/25/2019   BUN 30 (H) 10/25/2019   CO2 26 10/25/2019   TSH 1.42 08/24/2019   PSA 2.49 08/24/2019   INR 1.0 ratio 07/13/2009   HGBA1C 5.6 08/24/2019   Assessment and Plan: See notes  Follow Up Instructions: See notes   I discussed the  assessment and treatment plan with the patient. The patient was provided an opportunity to ask questions and all were answered. The patient agreed with the plan and demonstrated an understanding of the instructions.   The patient was advised to call back or seek an in-person evaluation if the symptoms worsen or if the condition fails to improve as anticipated.  [name], MD

## 2020-07-22 NOTE — Telephone Encounter (Signed)
done

## 2020-07-23 ENCOUNTER — Encounter: Payer: Self-pay | Admitting: Internal Medicine

## 2020-07-23 DIAGNOSIS — J329 Chronic sinusitis, unspecified: Secondary | ICD-10-CM | POA: Insufficient documentation

## 2020-07-23 NOTE — Assessment & Plan Note (Signed)
Lab Results  Component Value Date   HGBA1C 5.6 08/24/2019   Stable, pt to continue current medical treatment  - diet, wt control

## 2020-07-23 NOTE — Patient Instructions (Signed)
Please take all new medication as prescribed 

## 2020-07-23 NOTE — Assessment & Plan Note (Signed)
Mild to mod, for antibx course,  to f/u any worsening symptoms or concerns 

## 2020-07-23 NOTE — Assessment & Plan Note (Signed)
Mild to mod, for prednisone course,  to f/u any worsening symptoms or concerns

## 2020-08-01 ENCOUNTER — Other Ambulatory Visit (INDEPENDENT_AMBULATORY_CARE_PROVIDER_SITE_OTHER): Payer: Medicare HMO

## 2020-08-01 DIAGNOSIS — E785 Hyperlipidemia, unspecified: Secondary | ICD-10-CM | POA: Diagnosis not present

## 2020-08-01 DIAGNOSIS — Z0000 Encounter for general adult medical examination without abnormal findings: Secondary | ICD-10-CM

## 2020-08-01 DIAGNOSIS — E538 Deficiency of other specified B group vitamins: Secondary | ICD-10-CM | POA: Diagnosis not present

## 2020-08-01 DIAGNOSIS — E559 Vitamin D deficiency, unspecified: Secondary | ICD-10-CM

## 2020-08-01 DIAGNOSIS — R739 Hyperglycemia, unspecified: Secondary | ICD-10-CM

## 2020-08-01 LAB — LIPID PANEL
Cholesterol: 106 mg/dL (ref 0–200)
HDL: 36.1 mg/dL — ABNORMAL LOW (ref >39.00)
LDL Cholesterol: 59 mg/dL (ref 0–99)
NonHDL: 69.98
Total CHOL/HDL Ratio: 3
Triglycerides: 54 mg/dL (ref 0.0–149.0)
VLDL: 10.8 mg/dL (ref 0.0–40.0)

## 2020-08-01 LAB — BASIC METABOLIC PANEL
BUN: 27 mg/dL — ABNORMAL HIGH (ref 6–23)
CO2: 27 mEq/L (ref 19–32)
Calcium: 9.3 mg/dL (ref 8.4–10.5)
Chloride: 106 mEq/L (ref 96–112)
Creatinine, Ser: 1.12 mg/dL (ref 0.40–1.50)
GFR: 67.34 mL/min (ref >60.00)
Glucose, Bld: 105 mg/dL — ABNORMAL HIGH (ref 70–99)
Potassium: 4.6 mEq/L (ref 3.5–5.1)
Sodium: 140 mEq/L (ref 135–145)

## 2020-08-01 LAB — CBC WITH DIFFERENTIAL/PLATELET
Basophils Absolute: 0 10*3/uL (ref 0.0–0.1)
Basophils Relative: 0.4 % (ref 0.0–3.0)
Eosinophils Absolute: 0.1 10*3/uL (ref 0.0–0.7)
Eosinophils Relative: 2.2 % (ref 0.0–5.0)
HCT: 45.5 % (ref 39.0–52.0)
Hemoglobin: 15.3 g/dL (ref 13.0–17.0)
Lymphocytes Relative: 38.8 % (ref 12.0–46.0)
Lymphs Abs: 1.6 10*3/uL (ref 0.7–4.0)
MCHC: 33.6 g/dL (ref 30.0–36.0)
MCV: 95 fl (ref 78.0–100.0)
Monocytes Absolute: 0.4 10*3/uL (ref 0.1–1.0)
Monocytes Relative: 9.6 % (ref 3.0–12.0)
Neutro Abs: 2.1 10*3/uL (ref 1.4–7.7)
Neutrophils Relative %: 49 % (ref 43.0–77.0)
Platelets: 146 10*3/uL — ABNORMAL LOW (ref 150.0–400.0)
RBC: 4.79 Mil/uL (ref 4.22–5.81)
RDW: 12.3 % (ref 11.5–15.5)
WBC: 4.2 10*3/uL (ref 4.0–10.5)

## 2020-08-01 LAB — URINALYSIS, ROUTINE W REFLEX MICROSCOPIC
Bilirubin Urine: NEGATIVE
Hgb urine dipstick: NEGATIVE
Ketones, ur: NEGATIVE
Leukocytes,Ua: NEGATIVE
Nitrite: NEGATIVE
RBC / HPF: NONE SEEN (ref 0–?)
Specific Gravity, Urine: 1.025 (ref 1.000–1.030)
Total Protein, Urine: NEGATIVE
Urine Glucose: NEGATIVE
Urobilinogen, UA: 0.2 (ref 0.0–1.0)
WBC, UA: NONE SEEN (ref 0–?)
pH: 5.5 (ref 5.0–8.0)

## 2020-08-01 LAB — VITAMIN D 25 HYDROXY (VIT D DEFICIENCY, FRACTURES): VITD: 42.5 ng/mL (ref 30.00–100.00)

## 2020-08-01 LAB — HEPATIC FUNCTION PANEL
ALT: 18 U/L (ref 0–53)
AST: 22 U/L (ref 0–37)
Albumin: 4.4 g/dL (ref 3.5–5.2)
Alkaline Phosphatase: 67 U/L (ref 39–117)
Bilirubin, Direct: 0.1 mg/dL (ref 0.0–0.3)
Total Bilirubin: 0.6 mg/dL (ref 0.2–1.2)
Total Protein: 6.8 g/dL (ref 6.0–8.3)

## 2020-08-01 LAB — VITAMIN B12: Vitamin B-12: 623 pg/mL (ref 211–911)

## 2020-08-01 LAB — HEMOGLOBIN A1C: Hgb A1c MFr Bld: 5.7 % (ref 4.6–6.5)

## 2020-08-01 LAB — TSH: TSH: 1.19 u[IU]/mL (ref 0.35–4.50)

## 2020-08-01 LAB — PSA: PSA: 2.02 ng/mL (ref 0.10–4.00)

## 2020-08-07 DIAGNOSIS — G4733 Obstructive sleep apnea (adult) (pediatric): Secondary | ICD-10-CM | POA: Diagnosis not present

## 2020-08-09 ENCOUNTER — Encounter: Payer: Medicare HMO | Admitting: Internal Medicine

## 2020-08-21 ENCOUNTER — Encounter: Payer: Self-pay | Admitting: Internal Medicine

## 2020-08-21 ENCOUNTER — Ambulatory Visit (INDEPENDENT_AMBULATORY_CARE_PROVIDER_SITE_OTHER): Payer: Medicare HMO | Admitting: Internal Medicine

## 2020-08-21 ENCOUNTER — Other Ambulatory Visit: Payer: Self-pay

## 2020-08-21 VITALS — BP 138/80 | HR 62 | Temp 98.2°F | Ht 72.0 in | Wt 173.2 lb

## 2020-08-21 DIAGNOSIS — I700 Atherosclerosis of aorta: Secondary | ICD-10-CM | POA: Diagnosis not present

## 2020-08-21 DIAGNOSIS — M7701 Medial epicondylitis, right elbow: Secondary | ICD-10-CM

## 2020-08-21 DIAGNOSIS — R739 Hyperglycemia, unspecified: Secondary | ICD-10-CM

## 2020-08-21 DIAGNOSIS — K429 Umbilical hernia without obstruction or gangrene: Secondary | ICD-10-CM | POA: Diagnosis not present

## 2020-08-21 DIAGNOSIS — Z0001 Encounter for general adult medical examination with abnormal findings: Secondary | ICD-10-CM | POA: Diagnosis not present

## 2020-08-21 DIAGNOSIS — E7800 Pure hypercholesterolemia, unspecified: Secondary | ICD-10-CM

## 2020-08-21 DIAGNOSIS — M7700 Medial epicondylitis, unspecified elbow: Secondary | ICD-10-CM | POA: Insufficient documentation

## 2020-08-21 MED ORDER — MONTELUKAST SODIUM 10 MG PO TABS: ORAL_TABLET | ORAL | 3 refills | Status: DC

## 2020-08-21 MED ORDER — SIMVASTATIN 40 MG PO TABS: ORAL_TABLET | ORAL | 3 refills | Status: DC

## 2020-08-21 NOTE — Patient Instructions (Signed)
Ok to rest the right elbow and the medial epicondylitis should heal by itself in several weeks  You will be contacted regarding the referral for: Umbilical hernia  Please continue all other medications as before, and refills have been done if requested.  Please have the pharmacy call with any other refills you may need.  Please continue your efforts at being more active, low cholesterol diet, and weight control.  You are otherwise up to date with prevention measures today.  Please keep your appointments with your specialists as you may have planned  Please make an Appointment to return for your 1 year visit, or sooner if needed, with Lab testing by Appointment as well, to be done about 3-5 days before at the [facility] (so this is for TWO appointments - please see the scheduling desk as you leave)  Due to the ongoing Covid 19 pandemic, our lab now requires an appointment for any labs done at our office.  If you need labs done and do not have an appointment, please call our office ahead of time to schedule before presenting to the lab for your testing.

## 2020-08-21 NOTE — Progress Notes (Signed)
Patient ID: Danny Young, male   DOB: Jun 03, 1950, 70 y.o.   MRN: [id number]         Chief Complaint:: wellness exam and umbilical hernia, right medial epicondylitis       HPI:  Danny Young is a 70 y.o. male here for wellness exam; plans to have covid booster soon.  O/w up to date with preventive referrals and immunizations                        Also c/o 1-2 mo onset worsening umbilical hernia pain, mild, intemittent, dull, reducible, without radiation and Denies worsening reflux, dysphagia, n/v, bowel change or blood.  Also c/o mild right medial elbow pain, sharp, intermittent, without radiation x 1 mo, worse to overuse the arm, better to rest.  Pt denies chest pain, increased sob or doe, wheezing, orthopnea, PND, increased LE swelling, palpitations, dizziness or syncope.   Pt denies polydipsia, polyuria, or new focal neuro s/s.   Pt denies fever, wt loss, night sweats, loss of appetite, or other constitutional symptoms  No other new complaints    Wt Readings from Last 3 Encounters:  08/21/20 173 lb 3.2 oz (78.6 kg)  07/07/20 167 lb (75.8 kg)  08/31/19 167 lb 5 oz (75.9 kg)   BP Readings from Last 3 Encounters:  08/21/20 138/80  10/25/19 128/63  08/31/19 (!) 148/72   Immunization History  Administered Date(s) Administered  . Fluad Quad(high Dose 65+) 01/11/2019  . Influenza Split 01/20/2017  . Influenza Whole 06/06/2009, 02/07/2010  . Influenza, High Dose Seasonal PF 12/26/2016, 02/10/2018  . Influenza,inj,Quad PF,6+ Mos 01/06/2013, 05/25/2014  . Moderna Sars-Covid-2 Vaccination 05/25/2019, 06/22/2019, 03/04/2020  . Pneumococcal Conjugate-13 04/08/2018  . Pneumococcal Polysaccharide-23 08/24/2019  . Td 06/06/2009  . Tdap 08/24/2019  . Zoster 10/22/2011   Health Maintenance Due  Topic Date Due  . COVID-19 Vaccine (4 - Booster for Moderna series) 09/01/2020      Past Medical History:  Diagnosis Date  . ALLERGIC RHINITIS 01/19/2007   Qualifier: Diagnosis of  By: Jenny Reichmann  MD, Hunt Oris   . Allergy    seasonal  . Cataract   . 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 GERD 01/19/2007   Qualifier: Diagnosis of  By: Jenny Reichmann MD, Hunt Oris   . HYPERLIPIDEMIA 01/19/2007   Qualifier: Diagnosis of  By: Jenny Reichmann MD, Hunt Oris   . MIGRAINE HEADACHE 01/19/2007   Qualifier: Diagnosis of  By: Jenny Reichmann MD, Hunt Oris   . Sleep apnea    uses c-pap  . 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 COLONOSCOPY    . rotater cuff     left    reports that he has never smoked. He has never used smokeless tobacco. He reports current alcohol use. He reports that he does not use drugs. family history includes Hypertension in his father and mother. No Known Allergies Current Outpatient Medications on File Prior to Visit  Medication Sig Dispense Refill  . Ascorbic Acid (VITAMIN C) 500 MG CAPS Take 2 tablets by mouth daily.    Marland Kitchen aspirin 81 MG chewable tablet Chew 81 mg by mouth daily.     Marland Kitchen ibuprofen (ADVIL) 200 MG tablet Take 600 mg by mouth every 6 (six) hours as needed for fever or headache.    . Multiple Vitamin (MULTIVITAMIN) capsule Take 1 capsule by mouth daily.    Marland Kitchen  triamcinolone (NASACORT) 55 MCG/ACT AERO nasal inhaler Place 2 sprays into the nose daily. 1 Inhaler 12  . predniSONE (DELTASONE) 10 MG tablet 3 tabs by mouth per day for 3 days,2tabs per day for 3 days,1tab per day for 3 days (Patient not taking: Reported on 08/21/2020) 18 tablet 0   No current facility-administered medications on file prior to visit.        ROS:  All others reviewed and negative.  Objective        PE:  BP 138/80 (BP Location: Left Arm, Patient Position: Sitting, Cuff Size: Normal)   Pulse 62   Temp 98.2 F (36.8 C) (Oral)   Ht 6' (1.829 m)   Wt 173 lb 3.2 oz (78.6 kg)   SpO2 100%   BMI 23.49 kg/m                 Constitutional: Pt appears in NAD               HENT: Head: NCAT.                Right  Ear: External ear normal.                 Left Ear: External ear normal.                Eyes: . Pupils are equal, round, and reactive to light. Conjunctivae and EOM are normal               Nose: without d/c or deformity               Neck: Neck supple. Gross normal ROM               Cardiovascular: Normal rate and regular rhythm.                 Pulmonary/Chest: Effort normal and breath sounds without rales or wheezing.                Abd:  Soft, NT, ND, + BS, no organomegaly with mid tender reducible umbilical hernia;  Right medial epicondyle tender without swelling and RUE o/w neurovasc intact               Neurological: Pt is alert. At baseline orientation, motor grossly intact               Skin: Skin is warm. No rashes, no other new lesions, LE edema - none               Psychiatric: Pt behavior is normal without agitation   Micro: none  Cardiac tracings I have personally interpreted today:  none  Pertinent Radiological findings (summarize): none   Lab Results  Component Value Date   WBC 4.2 08/01/2020   HGB 15.3 08/01/2020   HCT 45.5 08/01/2020   PLT 146.0 (L) 08/01/2020   GLUCOSE 105 (H) 08/01/2020   CHOL 106 08/01/2020   TRIG 54.0 08/01/2020   HDL 36.10 (L) 08/01/2020   LDLDIRECT 84.1 01/06/2007   LDLCALC 59 08/01/2020   ALT 18 08/01/2020   AST 22 08/01/2020   NA 140 08/01/2020   K 4.6 08/01/2020   CL 106 08/01/2020   CREATININE 1.12 08/01/2020   BUN 27 (H) 08/01/2020   CO2 27 08/01/2020   TSH 1.19 08/01/2020   PSA 2.02 08/01/2020   INR 1.0 ratio 07/13/2009   HGBA1C 5.7 08/01/2020   Assessment/Plan:  Danny Young is a 70 y.o.  White or Caucasian [1] male with  has a past medical history of ALLERGIC RHINITIS (01/19/2007), Allergy, Cataract, Cervical spine degeneration (10/22/2011), GERD (01/19/2007), HYPERLIPIDEMIA (01/19/2007), MIGRAINE HEADACHE (01/19/2007), Sleep apnea, and WOLFF (WOLFE)-PARKINSON-WHITE (WPW) SYNDROME (01/19/2007).  Encounter for well adult  exam with abnormal findings Age and sex appropriate education and counseling updated with regular exercise and diet Referrals for preventative services - none needed Immunizations addressed - for covid booster soon Smoking counseling  - none needed Evidence for depression or other mood disorder - none significant Most recent labs reviewed. I have personally reviewed and have noted: 1) the patient's medical and social history 2) The patient's current medications and supplements 3) The patient's height, weight, and BMI have been recorded in the chart   Aortic atherosclerosis ([place]) To continue zocor with diet, excercise   Umbilical hernia Mild symptomatic but increase recently, for referral general surgury  Medial epicondylitis Mild, for topical volt gel prn, rest,  to f/u any worsening symptoms or concerns  Hyperlipidemia Lab Results  Component Value Date   LDLCALC 59 08/01/2020   Stable, pt to continue current statin zocor 40   Hyperglycemia Lab Results  Component Value Date   HGBA1C 5.7 08/01/2020   Stable, pt to continue current medical treatment  - diet   Followup: Return in about 1 year (around 08/21/2021).  [name], MD 08/28/2020 7:30 PM [facility]

## 2020-08-28 ENCOUNTER — Encounter: Payer: Self-pay | Admitting: Internal Medicine

## 2020-08-28 NOTE — Assessment & Plan Note (Signed)
Age and sex appropriate education and counseling updated with regular exercise and diet Referrals for preventative services - none needed Immunizations addressed - for covid booster soon Smoking counseling  - none needed Evidence for depression or other mood disorder - none significant Most recent labs reviewed. I have personally reviewed and have noted: 1) the patient's medical and social history 2) The patient's current medications and supplements 3) The patient's height, weight, and BMI have been recorded in the chart

## 2020-08-28 NOTE — Assessment & Plan Note (Signed)
Lab Results  Component Value Date   HGBA1C 5.7 08/01/2020   Stable, pt to continue current medical treatment  - diet

## 2020-08-28 NOTE — Assessment & Plan Note (Signed)
To continue zocor with diet, excercise

## 2020-08-28 NOTE — Assessment & Plan Note (Signed)
Mild symptomatic but increase recently, for referral general surgury

## 2020-08-28 NOTE — Assessment & Plan Note (Signed)
Lab Results  Component Value Date   LDLCALC 59 08/01/2020   Stable, pt to continue current statin zocor 40

## 2020-08-28 NOTE — Assessment & Plan Note (Signed)
Mild, for topical volt gel prn, rest,  to f/u any worsening symptoms or concerns

## 2020-09-06 DIAGNOSIS — G4733 Obstructive sleep apnea (adult) (pediatric): Secondary | ICD-10-CM | POA: Diagnosis not present

## 2020-09-12 DIAGNOSIS — M7541 Impingement syndrome of right shoulder: Secondary | ICD-10-CM | POA: Diagnosis not present

## 2020-09-12 DIAGNOSIS — M1812 Unilateral primary osteoarthritis of first carpometacarpal joint, left hand: Secondary | ICD-10-CM | POA: Diagnosis not present

## 2020-10-02 ENCOUNTER — Ambulatory Visit: Payer: Self-pay | Admitting: Surgery

## 2020-10-02 DIAGNOSIS — K429 Umbilical hernia without obstruction or gangrene: Secondary | ICD-10-CM | POA: Diagnosis not present

## 2020-10-07 DIAGNOSIS — G4733 Obstructive sleep apnea (adult) (pediatric): Secondary | ICD-10-CM | POA: Diagnosis not present

## 2021-05-15 DIAGNOSIS — L821 Other seborrheic keratosis: Secondary | ICD-10-CM | POA: Diagnosis not present

## 2021-05-15 DIAGNOSIS — Z85828 Personal history of other malignant neoplasm of skin: Secondary | ICD-10-CM | POA: Diagnosis not present

## 2021-05-15 DIAGNOSIS — L820 Inflamed seborrheic keratosis: Secondary | ICD-10-CM | POA: Diagnosis not present

## 2021-05-15 DIAGNOSIS — L570 Actinic keratosis: Secondary | ICD-10-CM | POA: Diagnosis not present

## 2021-05-22 DIAGNOSIS — H5213 Myopia, bilateral: Secondary | ICD-10-CM | POA: Diagnosis not present

## 2021-05-22 DIAGNOSIS — H25813 Combined forms of age-related cataract, bilateral: Secondary | ICD-10-CM | POA: Diagnosis not present

## 2021-05-28 ENCOUNTER — Ambulatory Visit
Admission: EM | Admit: 2021-05-28 | Discharge: 2021-05-28 | Disposition: A | Discharge status: Home / Self Care | Payer: Medicare HMO | Attending: Physician Assistant | Admitting: Physician Assistant

## 2021-05-28 ENCOUNTER — Other Ambulatory Visit: Payer: Self-pay

## 2021-05-28 ENCOUNTER — Encounter: Payer: Self-pay | Admitting: Emergency Medicine

## 2021-05-28 DIAGNOSIS — J0120 Acute ethmoidal sinusitis, unspecified: Secondary | ICD-10-CM | POA: Diagnosis not present

## 2021-05-28 MED ORDER — AMOXICILLIN-POT CLAVULANATE 875-125 MG PO TABS: 1.0000 | ORAL_TABLET | Freq: Two times a day (BID) | ORAL | 0 refills | Status: DC

## 2021-05-28 NOTE — ED Triage Notes (Addendum)
Pt here with sinus pressure and drainage, headache, and cough x 4 days. No body aches, chills, fever. Negative home covid test.

## 2021-05-28 NOTE — ED Provider Notes (Signed)
Danny Young    CSN: [id number] Arrival date & time: 05/28/21  1121      History   Chief Complaint Chief Complaint  Patient presents with   Cough   Nasal Congestion   Headache    HPI Danny Young is a 71 y.o. male.   The history is provided by the patient. No language interpreter was used.  Cough Cough characteristics:  Non-productive Sputum characteristics:  Nondescript Severity:  Moderate Onset quality:  Gradual Duration:  5 days Timing:  Constant Progression:  Worsening Chronicity:  New Smoker: no   Context: upper respiratory infection   Relieved by:  Nothing Worsened by:  Nothing Ineffective treatments:  None tried Associated symptoms: headaches   Headache Pain location:  Generalized Associated symptoms: cough    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   GERD 01/19/2007   Qualifier: Diagnosis of  By: Jenny Reichmann MD, Hunt Oris    HYPERLIPIDEMIA 01/19/2007   Qualifier: Diagnosis of  By: Jenny Reichmann MD, [place] 01/19/2007   Qualifier: Diagnosis of  By: Jenny Reichmann MD, Hunt Oris    Sleep apnea    uses c-pap   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 , Dr Klein/card     Patient Active Problem List   Diagnosis Date Noted   Umbilical hernia 37/16/9678   Medial epicondylitis 08/21/2020   Aortic atherosclerosis ([place]) 08/21/2020   Sinusitis 07/23/2020   Right shoulder pain 08/24/2019   Cough 02/10/2018   Wheezing 02/10/2018   OSA (obstructive sleep apnea) 02/06/2017   Hypersomnolence 12/26/2016   Facial skin lesion 12/26/2016   Hyperglycemia 12/26/2016   Tremor 06/07/2015   Pyrexia 06/07/2015   Midline low back pain without sciatica 06/07/2015   Plantar fasciitis of right foot 01/06/2013   Encounter for well adult exam with abnormal findings 10/22/2011    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 Authorizing Provider  amoxicillin-clavulanate (AUGMENTIN) 875-125 MG tablet Take 1 tablet by mouth 2 (two) times daily for 10 days. 05/28/21 06/07/21 Yes Fransico Meadow, PA-C  Ascorbic Acid (VITAMIN C) 500 MG CAPS Take 2 tablets by mouth daily.    [provider]  aspirin 81 MG chewable tablet Chew 81 mg by mouth daily.     [provider]  ibuprofen (ADVIL) 200 MG tablet Take 600 mg by mouth every 6 (six) hours as needed for fever or headache.    [provider]  montelukast (SINGULAIR) 10 MG tablet TAKE 1 TABLET BY MOUTH EVERYDAY AT BEDTIME 08/21/20   Biagio Borg, MD  Multiple Vitamin (MULTIVITAMIN) capsule Take 1 capsule by mouth daily.    [provider]  predniSONE (DELTASONE) 10 MG tablet 3 tabs by mouth per day for 3 days,2tabs per day for 3 days,1tab per day for 3 days Patient not taking: Reported on  08/21/2020 07/20/20   Biagio Borg, MD  simvastatin (ZOCOR) 40 MG tablet TAKE 1 TABLET BY MOUTH EVERYDAY AT BEDTIME 08/21/20   Biagio Borg, MD  triamcinolone (NASACORT) 55 MCG/ACT AERO nasal inhaler Place 2 sprays into the nose daily. 07/14/18   Biagio Borg, MD    Family History Family History  Problem Relation Age of Onset   Hypertension Mother    Hypertension Father     Social History Social History   Tobacco Use   Smoking status: Never   Smokeless tobacco: Never  Vaping Use   Vaping Use: Never used  Substance Use Topics   Alcohol use: Yes    Comment: very occasional wine   Drug use: No      Allergies   Patient has no known allergies.   Review of Systems Review of Systems  Respiratory:  Positive for cough.   Neurological:  Positive for headaches.  All other systems reviewed and are negative.   Physical Exam Triage Vital Signs ED Triage Vitals  Enc Vitals Group     BP 05/28/21 1156 134/61     Pulse Rate 05/28/21 1156 61     Resp 05/28/21 1156 18     Temp 05/28/21 1156 98.9 F (37.2 C)     Temp Source 05/28/21 1156 Oral     SpO2 05/28/21 1156 95 %     Weight --      Height --      Head Circumference --      Peak Flow --      Pain Score 05/28/21 1201 2     Pain Loc --      Pain Edu? --      Excl. in [place]? --    No data found.  Updated Vital Signs BP 134/61 (BP Location: Left Arm)    Pulse 61    Temp 98.9 F (37.2 C) (Oral)    Resp 18    SpO2 95%   Visual Acuity Right Eye Distance:   Left Eye Distance:   Bilateral Distance:    Right Eye Near:   Left Eye Near:    Bilateral Near:     Physical Exam Vitals and nursing note reviewed.  Constitutional:      Appearance: He is well-developed.  HENT:     Head: Normocephalic.     Mouth/Throat:     Mouth: Mucous membranes are moist.  Eyes:     Extraocular Movements: Extraocular movements intact.     Pupils: Pupils are equal, round, and reactive to light.  Cardiovascular:     Rate and Rhythm: Normal rate.  Pulmonary:     Effort: Pulmonary effort is normal.  Abdominal:     General: There is no distension.  Musculoskeletal:        General: Normal range of motion.     Cervical back: Normal range of motion.  Neurological:     Mental Status: He is alert and oriented to person, place, and time.  Psychiatric:        Mood and Affect: Mood normal.     UC Treatments / Results  Labs (all labs ordered are listed, but only abnormal results are displayed) Labs Reviewed - No data to display  EKG   Radiology No results found.  Procedures Procedures (including critical care time)  Medications  Ordered in UC Medications - No data to display  Initial Impression / Assessment and Plan / UC Course  I have reviewed the triage vital signs  and the nursing notes.  Pertinent labs & imaging results that were available during my care of the patient were reviewed by me and considered in my medical decision making (see chart for details).      Final Clinical Impressions(s) / UC Diagnoses   Final diagnoses:  Acute non-recurrent ethmoidal sinusitis     Discharge Instructions      Return if any problems.    ED Prescriptions     Medication Sig Dispense Auth. Provider   amoxicillin-clavulanate (AUGMENTIN) 875-125 MG tablet Take 1 tablet by mouth 2 (two) times daily for 10 days. 20 tablet Fransico Meadow, [place]      PDMP not reviewed this encounter. An After Visit Summary was printed and given to the patient.    Fransico Meadow, [place] 05/28/21 1226

## 2021-05-28 NOTE — Discharge Instructions (Addendum)
Return if any problems.

## 2021-06-04 ENCOUNTER — Encounter: Payer: Self-pay | Admitting: Emergency Medicine

## 2021-06-04 ENCOUNTER — Other Ambulatory Visit: Payer: Self-pay

## 2021-06-04 ENCOUNTER — Ambulatory Visit
Admission: EM | Admit: 2021-06-04 | Discharge: 2021-06-04 | Disposition: A | Discharge status: Home / Self Care | Payer: Medicare HMO | Attending: Emergency Medicine | Admitting: Emergency Medicine

## 2021-06-04 DIAGNOSIS — J0100 Acute maxillary sinusitis, unspecified: Secondary | ICD-10-CM | POA: Diagnosis not present

## 2021-06-04 DIAGNOSIS — R509 Fever, unspecified: Secondary | ICD-10-CM

## 2021-06-04 DIAGNOSIS — R079 Chest pain, unspecified: Secondary | ICD-10-CM

## 2021-06-04 DIAGNOSIS — Z1152 Encounter for screening for COVID-19: Secondary | ICD-10-CM | POA: Diagnosis not present

## 2021-06-04 MED ORDER — DOXYCYCLINE HYCLATE 100 MG PO CAPS: 100.0000 mg | ORAL_CAPSULE | Freq: Two times a day (BID) | ORAL | 0 refills | Status: AC

## 2021-06-04 NOTE — Discharge Instructions (Addendum)
Stop taking the Augmentin and start the doxycycline.    Go to the emergency department if you have chest pain, shortness of breath, or other concerning symptoms.    Your COVID and Flu tests are pending.  You should self quarantine until the test results are back.    Take Tylenol or ibuprofen as needed for fever or discomfort.  Rest and keep yourself hydrated.    Follow-up with your primary care provider in 2-3 days for a recheck.

## 2021-06-04 NOTE — ED Provider Notes (Signed)
Danny Young    CSN: [id number] Arrival date & time: 06/04/21  1318      History   Chief Complaint Chief Complaint  Patient presents with   Fever   Sinus pressure     HPI Danny Young is a 71 y.o. male.  Accompanied by his wife, patient presents with ongoing sinus pressure, congestion, fatigue, cough x 2 weeks.  He had a fever today of 103.  Treatment at home with ibuprofen; last taken at 10 am.  Also has been taking Coricidin.  He also reports chest pressure and shortness of breath when he coughs; none currently.  He denies other symptoms.  Patient was seen at this urgent care on 05/28/2021; diagnosed with acute sinusitis; treated with Augmentin; he has 2 days left of this medication.  He states his symptoms have not improved and are worse.    The history is provided by the patient, the spouse and medical records.   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 , Dr Klein/card     Patient Active Problem List   Diagnosis Date Noted   Umbilical hernia 41/74/0814   Medial epicondylitis 08/21/2020   Aortic atherosclerosis ([place]) 08/21/2020   Sinusitis 07/23/2020   Right shoulder pain 08/24/2019   Cough 02/10/2018   Wheezing 02/10/2018   OSA (obstructive sleep apnea) 02/06/2017   Hypersomnolence 12/26/2016   Facial skin lesion 12/26/2016   Hyperglycemia 12/26/2016   Tremor 06/07/2015   Pyrexia 06/07/2015   Midline low back pain without sciatica 06/07/2015   Plantar fasciitis of right foot  01/06/2013   Encounter for well adult exam with abnormal findings 10/22/2011   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 Authorizing Provider  doxycycline (VIBRAMYCIN) 100 MG capsule Take 1 capsule (100 mg total) by mouth 2 (two) times daily for 10 days. 06/04/21 06/14/21 Yes Sharion Balloon, NP  Ascorbic Acid (VITAMIN C) 500 MG CAPS Take 2 tablets by mouth daily.    [provider]  aspirin 81 MG chewable tablet Chew 81 mg by mouth daily.     [provider]  ibuprofen (ADVIL) 200 MG tablet Take 600 mg by mouth every 6 (six) hours as needed for fever or headache.    [provider]  montelukast (SINGULAIR) 10 MG tablet TAKE 1 TABLET BY MOUTH EVERYDAY AT BEDTIME 08/21/20   Biagio Borg, MD  Multiple Vitamin (MULTIVITAMIN) capsule Take 1 capsule by mouth daily.  [provider]  predniSONE (DELTASONE) 10 MG tablet 3 tabs by mouth per day for 3 days,2tabs per day for 3 days,1tab per day for 3 days Patient not taking: Reported on 08/21/2020 07/20/20   Biagio Borg, MD  simvastatin (ZOCOR) 40 MG tablet TAKE 1 TABLET BY MOUTH EVERYDAY AT BEDTIME 08/21/20   Biagio Borg, MD  triamcinolone (NASACORT) 55 MCG/ACT AERO nasal inhaler Place 2 sprays into the nose daily. 07/14/18   Biagio Borg, MD    Family History Family History  Problem Relation Age of Onset   Hypertension Mother    Hypertension Father     Social History Social History   Tobacco Use   Smoking status: Never   Smokeless tobacco: Never  Vaping Use   Vaping Use: Never used  Substance Use  Topics   Alcohol use: Yes    Comment: very occasional wine   Drug use: No     Allergies   Patient has no known allergies.   Review of Systems Review of Systems  Constitutional:  Positive for chills, fatigue and fever.  HENT:  Positive for congestion, postnasal drip and sinus pressure. Negative for ear pain and sore throat.   Respiratory:  Positive for cough. Negative for shortness of breath.   Cardiovascular:  Positive for chest pain. Negative for palpitations.  Skin:  Negative for color change and rash.  All other systems reviewed and are negative.   Physical Exam Triage Vital Signs ED Triage Vitals  Enc Vitals Group     BP      Pulse      Resp      Temp      Temp src      SpO2      Weight      Height      Head Circumference      Peak Flow      Pain Score      Pain Loc      Pain Edu?      Excl. in [place]?    No data found.  Updated Vital Signs BP 133/75 (BP Location: Left Arm)    Pulse 71    Temp 99 F (37.2 C) (Oral)    Resp 18    SpO2 96%   Visual Acuity Right Eye Distance:   Left Eye Distance:   Bilateral Distance:    Right Eye Near:   Left Eye Near:    Bilateral Near:     Physical Exam Vitals and nursing note reviewed.  Constitutional:      General: He is not in acute distress.    Appearance: Normal appearance. He is well-developed. He is not ill-appearing.  HENT:     Right Ear: Tympanic membrane normal.     Left Ear: Tympanic membrane normal.     Nose: Congestion present.     Mouth/Throat:     Mouth: Mucous membranes are moist.     Pharynx: Oropharynx is clear.  Cardiovascular:     Rate and Rhythm: Normal rate and regular rhythm.     Heart sounds: Normal heart sounds.  Pulmonary:     Effort: Pulmonary effort is normal. No respiratory distress.     Breath sounds: Normal breath sounds. No wheezing, rhonchi or rales.  Musculoskeletal:     Cervical back: Neck supple.  Skin:    General: Skin is warm and dry.  Neurological:     Mental Status:  He is alert.  Psychiatric:  Mood and Affect: Mood normal.        Behavior: Behavior normal.     UC Treatments / Results  Labs (all labs ordered are listed, but only abnormal results are displayed) Labs Reviewed  COVID-19, FLU A+B NAA    EKG   Radiology No results found.  Procedures Procedures (including critical care time)  Medications Ordered in UC Medications - No data to display  Initial Impression / Assessment and Plan / UC Course  I have reviewed the triage vital signs and the nursing notes.  Pertinent labs & imaging results that were available during my care of the patient were reviewed by me and considered in my medical decision making (see chart for details).   Acute sinusitis, fever, chest pain.  Patient is not improving on Augmentin. Changing antibiotic to doxycycline.  Instructed patient to follow up with his PCP for a recheck.  Patient is not having chest pain or SOB currently; only yesterday when he was coughing.  EKG shows sinus rhythm, rate 64, no ST elevation, compared to previous from 2017.  COVID and flu pending.  Instructed patient to self quarantine per CDC guidelines.  Discussed symptomatic treatment including Tylenol or ibuprofen, rest, hydration.  Patient agrees to plan of care.    Final Clinical Impressions(s) / UC Diagnoses   Final diagnoses:  Acute non-recurrent maxillary sinusitis  Fever, unspecified  Chest pain, unspecified type     Discharge Instructions      Stop taking the Augmentin and start the doxycycline.    Go to the emergency department if you have chest pain, shortness of breath, or other concerning symptoms.    Your COVID and Flu tests are pending.  You should self quarantine until the test results are back.    Take Tylenol or ibuprofen as needed for fever or discomfort.  Rest and keep yourself hydrated.    Follow-up with your primary care provider in 2-3 days for a recheck.          ED Prescriptions      Medication Sig Dispense Auth. Provider   doxycycline (VIBRAMYCIN) 100 MG capsule Take 1 capsule (100 mg total) by mouth 2 (two) times daily for 10 days. 20 capsule Sharion Balloon, NP      PDMP not reviewed this encounter.   Sharion Balloon, NP 06/04/21 1455

## 2021-06-04 NOTE — ED Triage Notes (Signed)
Pt was seen 1/30 and prescribed abx he now has started a fever and continues to have sinus pressure/pain

## 2021-06-05 LAB — COVID-19, FLU A+B NAA
Influenza A, NAA: NOT DETECTED
Influenza B, NAA: NOT DETECTED
SARS-CoV-2, NAA: NOT DETECTED

## 2021-08-23 ENCOUNTER — Ambulatory Visit (INDEPENDENT_AMBULATORY_CARE_PROVIDER_SITE_OTHER): Payer: Medicare HMO | Admitting: Internal Medicine

## 2021-08-23 ENCOUNTER — Encounter: Payer: Self-pay | Admitting: Internal Medicine

## 2021-08-23 VITALS — BP 118/60 | HR 53 | Temp 98.2°F | Ht 72.0 in | Wt 169.0 lb

## 2021-08-23 DIAGNOSIS — E7800 Pure hypercholesterolemia, unspecified: Secondary | ICD-10-CM | POA: Diagnosis not present

## 2021-08-23 DIAGNOSIS — M79644 Pain in right finger(s): Secondary | ICD-10-CM

## 2021-08-23 DIAGNOSIS — Z0001 Encounter for general adult medical examination with abnormal findings: Secondary | ICD-10-CM | POA: Diagnosis not present

## 2021-08-23 DIAGNOSIS — I700 Atherosclerosis of aorta: Secondary | ICD-10-CM | POA: Diagnosis not present

## 2021-08-23 DIAGNOSIS — M79645 Pain in left finger(s): Secondary | ICD-10-CM | POA: Diagnosis not present

## 2021-08-23 DIAGNOSIS — R739 Hyperglycemia, unspecified: Secondary | ICD-10-CM

## 2021-08-23 DIAGNOSIS — E559 Vitamin D deficiency, unspecified: Secondary | ICD-10-CM

## 2021-08-23 DIAGNOSIS — E538 Deficiency of other specified B group vitamins: Secondary | ICD-10-CM

## 2021-08-23 LAB — CBC WITH DIFFERENTIAL/PLATELET
Basophils Absolute: 0 10*3/uL (ref 0.0–0.1)
Basophils Relative: 0.5 % (ref 0.0–3.0)
Eosinophils Absolute: 0.1 10*3/uL (ref 0.0–0.7)
Eosinophils Relative: 1.4 % (ref 0.0–5.0)
HCT: 45.8 % (ref 39.0–52.0)
Hemoglobin: 15.5 g/dL (ref 13.0–17.0)
Lymphocytes Relative: 23.8 % (ref 12.0–46.0)
Lymphs Abs: 1.6 10*3/uL (ref 0.7–4.0)
MCHC: 33.8 g/dL (ref 30.0–36.0)
MCV: 96.7 fl (ref 78.0–100.0)
Monocytes Absolute: 0.4 10*3/uL (ref 0.1–1.0)
Monocytes Relative: 6.3 % (ref 3.0–12.0)
Neutro Abs: 4.6 10*3/uL (ref 1.4–7.7)
Neutrophils Relative %: 68 % (ref 43.0–77.0)
Platelets: 216 10*3/uL (ref 150.0–400.0)
RBC: 4.74 Mil/uL (ref 4.22–5.81)
RDW: 12.6 % (ref 11.5–15.5)
WBC: 6.8 10*3/uL (ref 4.0–10.5)

## 2021-08-23 LAB — BASIC METABOLIC PANEL
BUN: 27 mg/dL — ABNORMAL HIGH (ref 6–23)
CO2: 27 mEq/L (ref 19–32)
Calcium: 9.8 mg/dL (ref 8.4–10.5)
Chloride: 104 mEq/L (ref 96–112)
Creatinine, Ser: 1.1 mg/dL (ref 0.40–1.50)
GFR: 68.3 mL/min (ref >60.00)
Glucose, Bld: 97 mg/dL (ref 70–99)
Potassium: 5.3 mEq/L — ABNORMAL HIGH (ref 3.5–5.1)
Sodium: 139 mEq/L (ref 135–145)

## 2021-08-23 LAB — URINALYSIS, ROUTINE W REFLEX MICROSCOPIC
Bilirubin Urine: NEGATIVE
Hgb urine dipstick: NEGATIVE
Ketones, ur: NEGATIVE
Leukocytes,Ua: NEGATIVE
Nitrite: NEGATIVE
Specific Gravity, Urine: 1.025 (ref 1.000–1.030)
Total Protein, Urine: NEGATIVE
Urine Glucose: NEGATIVE
Urobilinogen, UA: 0.2 (ref 0.0–1.0)
pH: 6 (ref 5.0–8.0)

## 2021-08-23 LAB — LIPID PANEL
Cholesterol: 114 mg/dL (ref 0–200)
HDL: 40.2 mg/dL (ref >39.00)
LDL Cholesterol: 63 mg/dL (ref 0–99)
NonHDL: 73.68
Total CHOL/HDL Ratio: 3
Triglycerides: 52 mg/dL (ref 0.0–149.0)
VLDL: 10.4 mg/dL (ref 0.0–40.0)

## 2021-08-23 LAB — HEPATIC FUNCTION PANEL
ALT: 21 U/L (ref 0–53)
AST: 24 U/L (ref 0–37)
Albumin: 4.8 g/dL (ref 3.5–5.2)
Alkaline Phosphatase: 69 U/L (ref 39–117)
Bilirubin, Direct: 0.2 mg/dL (ref 0.0–0.3)
Total Bilirubin: 0.8 mg/dL (ref 0.2–1.2)
Total Protein: 7.4 g/dL (ref 6.0–8.3)

## 2021-08-23 LAB — VITAMIN B12: Vitamin B-12: 619 pg/mL (ref 211–911)

## 2021-08-23 LAB — TSH: TSH: 1.18 u[IU]/mL (ref 0.35–5.50)

## 2021-08-23 LAB — HEMOGLOBIN A1C: Hgb A1c MFr Bld: 5.5 % (ref 4.6–6.5)

## 2021-08-23 LAB — PSA: PSA: 2.47 ng/mL (ref 0.10–4.00)

## 2021-08-23 LAB — VITAMIN D 25 HYDROXY (VIT D DEFICIENCY, FRACTURES): VITD: 63.45 ng/mL (ref 30.00–100.00)

## 2021-08-23 NOTE — Progress Notes (Signed)
Patient ID: Danny Young, male   DOB: 1950-11-20, 71 y.o.   MRN: [id number] ? ? ? ?     Chief Complaint:: wellness exam and bilateral thumb pain, hyperglycemia, hld ? ?     HPI:  Danny Young is a 71 y.o. male here for wellness exam; decliens shingrix, o/w up to date ?         ?              Also c/o bilateral thumb cmc arthritic pain after lifetime of overuse with hands; mild to mod, intermittent, sharp and dull at times, worse to use, better to rest for > 3 mo.  Pt denies chest pain, increased sob or doe, wheezing, orthopnea, PND, increased LE swelling, palpitations, dizziness or syncope.   Pt denies polydipsia, polyuria, or new focal neuro s/s.    Pt denies fever, wt loss, night sweats, loss of appetite, or other constitutional symptoms   ?  ?Wt Readings from Last 3 Encounters:  ?08/23/21 169 lb (76.7 kg)  ?08/21/20 173 lb 3.2 oz (78.6 kg)  ?07/07/20 167 lb (75.8 kg)  ? ?BP Readings from Last 3 Encounters:  ?08/23/21 118/60  ?06/04/21 133/75  ?05/28/21 134/61  ? ?Immunization History  ?Administered Date(s) Administered  ? Fluad Quad(high Dose 65+) 01/11/2019  ? Influenza Split 01/20/2017  ? Influenza Whole 06/06/2009, 02/07/2010  ? Influenza, High Dose Seasonal PF 12/26/2016, 02/10/2018, 03/01/2021  ? Influenza,inj,Quad PF,6+ Mos 01/06/2013, 05/25/2014  ? Moderna Sars-Covid-2 Vaccination 05/25/2019, 06/22/2019, 03/04/2020  ? Pension scheme manager 30yr & up 02/12/2021  ? Pneumococcal Conjugate-13 04/08/2018  ? Pneumococcal Polysaccharide-23 08/24/2019  ? Td 06/06/2009  ? Tdap 08/24/2019  ? Zoster, Live 10/22/2011  ? ?There are no preventive care reminders to display for this patient. ? ?  ? ?Past Medical History:  ?Diagnosis Date  ? ALLERGIC RHINITIS 01/19/2007  ? Qualifier: Diagnosis of  By: JJenny ReichmannMD, JHunt Oris  ? Allergy   ? seasonal  ? Cataract   ? 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 GERD 01/19/2007  ? Qualifier: Diagnosis of  By: JJenny ReichmannMD, JHunt Oris  ? HYPERLIPIDEMIA 01/19/2007  ?  Qualifier: Diagnosis of  By: JJenny ReichmannMD, JHunt Oris  ? MIGRAINE HEADACHE 01/19/2007  ? Qualifier: Diagnosis of  By: JJenny ReichmannMD, JHunt Oris  ? Sleep apnea   ? uses c-pap  ? 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 S/p ablation 2011 , Dr Klein/card   ? ?Past Surgical History:  ?Procedure Laterality Date  ? COLONOSCOPY    ? rotater cuff    ? left  ? ? reports that he has never smoked. He has never used smokeless tobacco. He reports current alcohol use. He reports that he does not use drugs. ?family history includes Hypertension in his father and mother. ?No Known Allergies ?Current Outpatient Medications on File Prior to Visit  ?Medication Sig Dispense Refill  ? Ascorbic Acid (VITAMIN C) 500 MG CAPS Take 2 tablets by mouth daily.    ? aspirin 81 MG chewable tablet Chew 81 mg by mouth daily.     ? ibuprofen (ADVIL) 200 MG tablet Take 600 mg by mouth every 6 (six) hours as needed for fever or headache.    ? montelukast (SINGULAIR) 10 MG tablet TAKE 1 TABLET BY MOUTH EVERYDAY AT BEDTIME 90 tablet 3  ? Multiple Vitamin (MULTIVITAMIN) capsule Take 1 capsule by mouth daily.    ? simvastatin (ZOCOR) 40 MG tablet TAKE 1  TABLET BY MOUTH EVERYDAY AT BEDTIME 90 tablet 3  ? triamcinolone (NASACORT) 55 MCG/ACT AERO nasal inhaler Place 2 sprays into the nose daily. 1 Inhaler 12  ? ?No current facility-administered medications on file prior to visit.  ? ?     ROS:  All others reviewed and negative. ? ?Objective  ? ?     PE:  BP 118/60 (BP Location: Right Arm, Patient Position: Sitting, Cuff Size: Large)   Pulse (!) 53   Temp 98.2 ?F (36.8 ?C) (Oral)   Ht 6' (1.829 m)   Wt 169 lb (76.7 kg)   SpO2 98%   BMI 22.92 kg/m?  ? ?              Constitutional: Pt appears in NAD ?              HENT: Head: NCAT.  ?              Right Ear: External ear normal.   ?              Left Ear: External ear normal.  ?              Eyes: . Pupils are equal, round, and reactive to light. Conjunctivae and EOM are normal ?              Nose:  without d/c or deformity ?              Neck: Neck supple. Gross normal ROM ?              Cardiovascular: Normal rate and regular rhythm.   ?              Pulmonary/Chest: Effort normal and breath sounds without rales or wheezing.  ?              Abd:  Soft, NT, ND, + BS, no organomegaly; bilat cmc's with mod to severe degenerative changes ?              Neurological: Pt is alert. At baseline orientation, motor grossly intact ?              Skin: Skin is warm. No rashes, no other new lesions, LE edema - none ?              Psychiatric: Pt behavior is normal without agitation  ? ?Micro: none ? ?Cardiac tracings I have personally interpreted today:  none ? ?Pertinent Radiological findings (summarize): none  ? ?Lab Results  ?Component Value Date  ? WBC 6.8 08/23/2021  ? HGB 15.5 08/23/2021  ? HCT 45.8 08/23/2021  ? PLT 216.0 08/23/2021  ? GLUCOSE 97 08/23/2021  ? CHOL 114 08/23/2021  ? TRIG 52.0 08/23/2021  ? HDL 40.20 08/23/2021  ? LDLDIRECT 84.1 01/06/2007  ? [place] 63 08/23/2021  ? ALT 21 08/23/2021  ? AST 24 08/23/2021  ? NA 139 08/23/2021  ? K 5.3 No hemolysis seen (H) 08/23/2021  ? CL 104 08/23/2021  ? CREATININE 1.10 08/23/2021  ? BUN 27 (H) 08/23/2021  ? CO2 27 08/23/2021  ? TSH 1.18 08/23/2021  ? PSA 2.47 08/23/2021  ? INR 1.0 ratio 07/13/2009  ? HGBA1C 5.5 08/23/2021  ? ?Assessment/Plan:  ?Danny Young is a 71 y.o. White or Caucasian [1] male with  has a past medical history of ALLERGIC RHINITIS (01/19/2007), Allergy, Cataract, Cervical spine degeneration (10/22/2011), GERD (01/19/2007), HYPERLIPIDEMIA (01/19/2007), MIGRAINE HEADACHE (01/19/2007), Sleep apnea, and WOLFF (WOLFE)-PARKINSON-WHITE (  WPW) SYNDROME (01/19/2007). ? ?Encounter for well adult exam with abnormal findings ?Age and sex appropriate education and counseling updated with regular exercise and diet ?Referrals for preventative services - none needed ?Immunizations addressed - declines shingrix ?Smoking counseling  - none needed ?Evidence for  depression or other mood disorder - none significant ?Most recent labs reviewed. ?I have personally reviewed and have noted: ?1) the patient's medical and social history ?2) The patient's current medications and supplements ?3) The patient's height, weight, and BMI have been recorded in the chart ? ? ?Hyperlipidemia ?Lab Results  ?Component Value Date  ? [place] 63 08/23/2021  ? ?Stable, pt to continue current statin zocor ? ? ?Hyperglycemia ?Lab Results  ?Component Value Date  ? HGBA1C 5.5 08/23/2021  ? ?Stable, pt to continue current medical treatment  - diet ? ? ?Bilateral thumb pain ?Mild worseniing chronic persistent, for volt gel prn,  to f/u any worsening symptoms or concerns ? ?Aortic atherosclerosis ([place]) ?For pt to continue zocor, low chol diet, excercise ? ?Followup: Return in about 1 year (around 08/24/2022). ? ?[name], MD 08/27/2021 8:28 PM ?[facility] ?[place] ?Internal Medicine ?

## 2021-08-23 NOTE — Patient Instructions (Addendum)
Please consider the Shingrix shingles shot at the pharmacy if covered by you insurance ? ?Please take the OTC Voltaren gel for the thumb pain as needed ? ?Please continue all other medications as before, and refills have been done if requested. ? ?Please have the pharmacy call with any other refills you may need. ? ?Please continue your efforts at being more active, low cholesterol diet, and weight control. ? ?You are otherwise up to date with prevention measures today. ? ?Please keep your appointments with your specialists as you may have planned ? ?Please go to the LAB at the blood drawing area for the tests to be done ? ?You will be contacted by phone if any changes need to be made immediately.  Otherwise, you will receive a letter about your results with an explanation, but please check with MyChart first. ? ?Please remember to sign up for MyChart if you have not done so, as this will be important to you in the future with finding out test results, communicating by private email, and scheduling acute appointments online when needed. ? ?Please make an Appointment to return for your 1 year visit, or sooner if needed ?

## 2021-08-27 ENCOUNTER — Encounter: Payer: Self-pay | Admitting: Internal Medicine

## 2021-08-27 NOTE — Assessment & Plan Note (Signed)

## 2021-08-27 NOTE — Assessment & Plan Note (Signed)
Lab Results  ?Component Value Date  ? HGBA1C 5.5 08/23/2021  ? ?Stable, pt to continue current medical treatment  - diet ? ?

## 2021-08-27 NOTE — Assessment & Plan Note (Signed)
Mild worseniing chronic persistent, for volt gel prn,  to f/u any worsening symptoms or concerns ?

## 2021-08-27 NOTE — Assessment & Plan Note (Signed)
For pt to continue zocor, low chol diet, excercise ?

## 2021-08-27 NOTE — Assessment & Plan Note (Signed)
Lab Results  ?Component Value Date  ? [place] 63 08/23/2021  ? ?Stable, pt to continue current statin zocor ? ?

## 2021-09-23 ENCOUNTER — Other Ambulatory Visit: Payer: Self-pay | Admitting: Internal Medicine

## 2021-09-23 NOTE — Telephone Encounter (Signed)
Please refill as per office routine med refill policy (all routine meds to be refilled for 3 mo or monthly (per pt preference) up to one year from last visit, then month to month grace period for 3 mo, then further med refills will have to be denied) ? ?

## 2021-10-18 ENCOUNTER — Telehealth: Payer: Self-pay | Admitting: Internal Medicine

## 2021-10-18 NOTE — Telephone Encounter (Signed)
Pt.called in and states in My Chart he was due an immunization Pts chart is not showing shot due at this time  Please Advise

## 2021-12-24 ENCOUNTER — Ambulatory Visit: Payer: Medicare HMO | Admitting: Podiatry

## 2021-12-24 DIAGNOSIS — Z79899 Other long term (current) drug therapy: Secondary | ICD-10-CM | POA: Diagnosis not present

## 2021-12-24 DIAGNOSIS — B351 Tinea unguium: Secondary | ICD-10-CM

## 2021-12-24 DIAGNOSIS — L84 Corns and callosities: Secondary | ICD-10-CM

## 2021-12-24 NOTE — Progress Notes (Unsigned)
Subjective:   Patient ID: Danny Young, male   DOB: 71 y.o.   MRN: [id number]   HPI Chief Complaint  Patient presents with   Nail Problem    Nail Fungus, started 6 mths ago, nails are thick and yellow, Pt is having some numbness on the tips of the toes,     He has tried to trim some of the nail out himeself. He has used some athletes foot cream on the nails.    ROS      Objective:  Physical Exam  ***     Assessment:  ***     Plan:  ***

## 2021-12-24 NOTE — Patient Instructions (Signed)
Terbinafine Tablets What is this medication? TERBINAFINE (TER bin a feen) treats fungal infections of the nails. It belongs to a group of medications called antifungals. It will not treat infections caused by bacteria or viruses. This medicine may be used for other purposes; ask your health care provider or pharmacist if you have questions. COMMON BRAND NAME(S): Lamisil, Terbinex What should I tell my care team before I take this medication? They need to know if you have any of these conditions: Liver disease An unusual or allergic reaction to terbinafine, other medications, foods, dyes, or preservatives Pregnant or trying to get pregnant Breast-feeding How should I use this medication? Take this medication by mouth with water. Take it as directed on the prescription label at the same time every day. You can take it with or without food. If it upsets your stomach, take it with food. Keep taking it unless your care team tells you to stop. A special MedGuide will be given to you by the pharmacist with each prescription and refill. Be sure to read this information carefully each time. Talk to your care team regarding the use of this medication in children. Special care may be needed. Overdosage: If you think you have taken too much of this medicine contact a poison control center or emergency room at once. NOTE: This medicine is only for you. Do not share this medicine with others. What if I miss a dose? If you miss a dose, take it as soon as you can unless it is more than 4 hours late. If it is more than 4 hours late, skip the missed dose. Take the next dose at the normal time. What may interact with this medication? Do not take this medication with any of the following: Pimozide Thioridazine This medication may also interact with the following: Beta blockers Caffeine Certain medications for mental health conditions Cimetidine Cyclosporine Medications for fungal infections like fluconazole  and ketoconazole Medications for irregular heartbeat like amiodarone, flecainide and propafenone Rifampin Warfarin This list may not describe all possible interactions. Give your health care provider a list of all the medicines, herbs, non-prescription drugs, or dietary supplements you use. Also tell them if you smoke, drink alcohol, or use illegal drugs. Some items may interact with your medicine. What should I watch for while using this medication? Visit your care team for regular checks on your progress. You may need blood work while you are taking this medication. It may be some time before you see the benefit from this medication. This medication may cause serious skin reactions. They can happen weeks to months after starting the medication. Contact your care team right away if you notice fevers or flu-like symptoms with a rash. The rash may be red or purple and then turn into blisters or peeling of the skin. Or, you might notice a red rash with swelling of the face, lips or lymph nodes in your neck or under your arms. This medication can make you more sensitive to the sun. Keep out of the sun, If you cannot avoid being in the sun, wear protective clothing and sunscreen. Do not use sun lamps or tanning beds/booths. What side effects may I notice from receiving this medication? Side effects that you should report to your care team as soon as possible: Allergic reactions--skin rash, itching, hives, swelling of the face, lips, tongue, or throat Change in sense of smell Change in taste Infection--fever, chills, cough, or sore throat Liver injury--right upper belly pain, loss of appetite, nausea,   light-colored stool, dark yellow or brown urine, yellowing skin or eyes, unusual weakness or fatigue Low red blood cell level--unusual weakness or fatigue, dizziness, headache, trouble breathing Lupus-like syndrome--joint pain, swelling, or stiffness, butterfly-shaped rash on the face, rashes that get worse  in the sun, fever, unusual weakness or fatigue Rash, fever, and swollen lymph nodes Redness, blistering, peeling, or loosening of the skin, including inside the mouth Unusual bruising or bleeding Worsening mood, feelings of depression Side effects that usually do not require medical attention (report to your care team if they continue or are bothersome): Diarrhea Gas Headache Nausea Stomach pain Upset stomach This list may not describe all possible side effects. Call your doctor for medical advice about side effects. You may report side effects to FDA at 1-800-FDA-1088. Where should I keep my medication? Keep out of the reach of children and pets. Store between 20 and 25 degrees C (68 and 77 degrees F). Protect from light. Get rid of any unused medication after the expiration date. To get rid of medications that are no longer needed or have expired: Take the medication to a medication take-back program. Check with your pharmacy or law enforcement to find a location. If you cannot return the medication, check the label or package insert to see if the medication should be thrown out in the garbage or flushed down the toilet. If you are not sure, ask your care team. If it is safe to put it in the trash, take the medication out of the container. Mix the medication with cat litter, dirt, coffee grounds, or other unwanted substance. Seal the mixture in a bag or container. Put it in the trash. NOTE: This sheet is a summary. It may not cover all possible information. If you have questions about this medicine, talk to your doctor, pharmacist, or health care provider.  2023 Elsevier/Gold Standard (2020-11-07 00:00:00)  

## 2021-12-25 LAB — CBC WITH DIFFERENTIAL/PLATELET
Absolute Monocytes: 383 cells/uL (ref 200–950)
Basophils Absolute: 22 cells/uL (ref 0–200)
Basophils Relative: 0.4 %
Eosinophils Absolute: 59 cells/uL (ref 15–500)
Eosinophils Relative: 1.1 %
HCT: 48.6 % (ref 38.5–50.0)
Hemoglobin: 16.5 g/dL (ref 13.2–17.1)
Lymphs Abs: 1701 cells/uL (ref 850–3900)
MCH: 32.6 pg (ref 27.0–33.0)
MCHC: 34 g/dL (ref 32.0–36.0)
MCV: 96 fL (ref 80.0–100.0)
MPV: 11.4 fL (ref 7.5–12.5)
Monocytes Relative: 7.1 %
Neutro Abs: 3235 cells/uL (ref 1500–7800)
Neutrophils Relative %: 59.9 %
Platelets: 175 10*3/uL (ref 140–400)
RBC: 5.06 10*6/uL (ref 4.20–5.80)
RDW: 11.9 % (ref 11.0–15.0)
Total Lymphocyte: 31.5 %
WBC: 5.4 10*3/uL (ref 3.8–10.8)

## 2021-12-25 LAB — HEPATIC FUNCTION PANEL
AG Ratio: 1.8 (calc) (ref 1.0–2.5)
ALT: 19 U/L (ref 9–46)
AST: 22 U/L (ref 10–35)
Albumin: 4.8 g/dL (ref 3.6–5.1)
Alkaline phosphatase (APISO): 75 U/L (ref 35–144)
Bilirubin, Direct: 0.2 mg/dL (ref 0.0–0.2)
Globulin: 2.7 g/dL (calc) (ref 1.9–3.7)
Indirect Bilirubin: 0.6 mg/dL (calc) (ref 0.2–1.2)
Total Bilirubin: 0.8 mg/dL (ref 0.2–1.2)
Total Protein: 7.5 g/dL (ref 6.1–8.1)

## 2021-12-27 ENCOUNTER — Other Ambulatory Visit: Payer: Self-pay | Admitting: Podiatry

## 2021-12-27 DIAGNOSIS — Z79899 Other long term (current) drug therapy: Secondary | ICD-10-CM

## 2021-12-27 MED ORDER — TERBINAFINE HCL 250 MG PO TABS
250.0000 mg | ORAL_TABLET | Freq: Every day | ORAL | 0 refills | Status: DC
Start: 2021-12-27 — End: 2022-04-01

## 2022-01-23 ENCOUNTER — Ambulatory Visit (INDEPENDENT_AMBULATORY_CARE_PROVIDER_SITE_OTHER): Payer: Medicare HMO | Admitting: Internal Medicine

## 2022-01-23 ENCOUNTER — Telehealth: Payer: Self-pay | Admitting: Internal Medicine

## 2022-01-23 ENCOUNTER — Encounter: Payer: Self-pay | Admitting: Internal Medicine

## 2022-01-23 VITALS — BP 132/70 | HR 69 | Temp 98.2°F | Ht 72.0 in | Wt 173.0 lb

## 2022-01-23 DIAGNOSIS — R739 Hyperglycemia, unspecified: Secondary | ICD-10-CM | POA: Diagnosis not present

## 2022-01-23 DIAGNOSIS — E7800 Pure hypercholesterolemia, unspecified: Secondary | ICD-10-CM

## 2022-01-23 DIAGNOSIS — J069 Acute upper respiratory infection, unspecified: Secondary | ICD-10-CM | POA: Diagnosis not present

## 2022-01-23 MED ORDER — HYDROCODONE BIT-HOMATROP MBR 5-1.5 MG/5ML PO SOLN: 5.0000 mL | Freq: Four times a day (QID) | ORAL | 0 refills | Status: AC | PRN

## 2022-01-23 MED ORDER — AZITHROMYCIN 250 MG PO TABS: ORAL_TABLET | ORAL | 1 refills | Status: AC

## 2022-01-23 NOTE — Telephone Encounter (Signed)
Pharmacy states that there is no alternative to the Hycodan but suggests the patient tries  GoodRx for an affordable price. Patient informed and will keep [place] updated.

## 2022-01-23 NOTE — Progress Notes (Signed)
Patient ID: Danny Young, male   DOB: Apr 24, 1951, 71 y.o.   MRN: [id number]        Chief Complaint: follow up HTN, HLD and hyperglycemia, URI symtpoms and cough       HPI:  Danny Young is a 71 y.o. male  Here with 2-3 days acute onset fever, facial pain, pressure, headache, general weakness and malaise, with mild ST and scant prod cough with mild sob, but pt denies chest pain, wheezing, orthopnea, PND, increased LE swelling, palpitations, dizziness or syncope.   Pt denies polydipsia, polyuria, or new focal neuro s/s.    Pt denies fever, wt loss, night sweats, loss of appetite, or other constitutional symptoms  Had a COVID test negative at home yesterday       Wt Readings from Last 3 Encounters:  01/23/22 173 lb (78.5 kg)  08/23/21 169 lb (76.7 kg)  08/21/20 173 lb 3.2 oz (78.6 kg)   BP Readings from Last 3 Encounters:  01/23/22 132/70  08/23/21 118/60  06/04/21 133/75         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   GERD 01/19/2007   Qualifier: Diagnosis of  By: Jenny Reichmann MD, Hunt Oris    HYPERLIPIDEMIA 01/19/2007   Qualifier: Diagnosis of  By: Jenny Reichmann MD, [place] 01/19/2007   Qualifier: Diagnosis of  By: Jenny Reichmann MD, Hunt Oris    Sleep apnea    uses c-pap   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 He has never used smokeless tobacco. He reports current alcohol use. He reports that he does not use drugs. family history includes Hypertension in his father and mother. No Known Allergies Current Outpatient Medications on File Prior to Visit  Medication Sig Dispense Refill   Ascorbic Acid (VITAMIN C) 500 MG CAPS Take 2 tablets by  mouth daily.     aspirin 81 MG chewable tablet Chew 81 mg by mouth daily.      ibuprofen (ADVIL) 200 MG tablet Take 600 mg by mouth every 6 (six) hours as needed for fever or headache.     montelukast (SINGULAIR) 10 MG tablet TAKE 1 TABLET BY MOUTH EVERYDAY AT BEDTIME 90 tablet 3   Multiple Vitamin (MULTIVITAMIN) capsule Take 1 capsule by mouth daily.     simvastatin (ZOCOR) 40 MG tablet TAKE 1 TABLET BY MOUTH EVERYDAY AT BEDTIME 90 tablet 3   terbinafine (LAMISIL) 250 MG tablet Take 1 tablet (250 mg total) by mouth daily. 90 tablet 0   triamcinolone (NASACORT) 55 MCG/ACT AERO nasal inhaler Place 2 sprays into the nose daily. 1 Inhaler 12   SHINGRIX injection      No current facility-administered medications on file prior to visit.        ROS:  All others reviewed and negative.  Objective        PE:  BP 132/70 (BP Location: Left Arm, Patient Position: Sitting, Cuff Size: Large)   Pulse 69   Temp 98.2 F (36.8 C) (Oral)   Ht 6' (1.829 m)   Wt 173 lb (  78.5 kg)   SpO2 93%   BMI 23.46 kg/m                 Constitutional: Pt appears in NAD               HENT: Head: NCAT.                Right Ear: External ear normal.                 Left Ear: External ear normal. Bilat tm's with mild erythema.  Max sinus areas mild tender.  Pharynx with mild erythema, no exudate               Eyes: . Pupils are equal, round, and reactive to light. Conjunctivae and EOM are normal               Nose: without d/c or deformity               Neck: Neck supple. Gross normal ROM               Cardiovascular: Normal rate and regular rhythm.                 Pulmonary/Chest: Effort normal and breath sounds without rales or wheezing.                               Neurological: Pt is alert. At baseline orientation, motor grossly intact               Skin: Skin is warm. No rashes, no other new lesions, LE edema - none               Psychiatric: Pt behavior is normal without agitation   Micro: none  Cardiac  tracings I have personally interpreted today:  none  Pertinent Radiological findings (summarize): none   Lab Results  Component Value Date   WBC 5.4 12/24/2021   HGB 16.5 12/24/2021   HCT 48.6 12/24/2021   PLT 175 12/24/2021   GLUCOSE 97 08/23/2021   CHOL 114 08/23/2021   TRIG 52.0 08/23/2021   HDL 40.20 08/23/2021   LDLDIRECT 84.1 01/06/2007   LDLCALC 63 08/23/2021   ALT 19 12/24/2021   AST 22 12/24/2021   NA 139 08/23/2021   K 5.3 No hemolysis seen (H) 08/23/2021   CL 104 08/23/2021   CREATININE 1.10 08/23/2021   BUN 27 (H) 08/23/2021   CO2 27 08/23/2021   TSH 1.18 08/23/2021   PSA 2.47 08/23/2021   INR 1.0 ratio 07/13/2009   HGBA1C 5.5 08/23/2021   Assessment/Plan:  Danny Young is a 71 y.o. White or Caucasian [1] male with  has a past medical history of ALLERGIC RHINITIS (01/19/2007), Allergy, Cataract, Cervical spine degeneration (10/22/2011), GERD (01/19/2007), HYPERLIPIDEMIA (01/19/2007), MIGRAINE HEADACHE (01/19/2007), Sleep apnea, and WOLFF (WOLFE)-PARKINSON-WHITE (WPW) SYNDROME (01/19/2007).  Acute upper respiratory infection Mild to mod, for antibx course zpack, and cough med prn,  to f/u any worsening symptoms or concerns, also to recheck COVID testing tomorrow to be certain  Hyperglycemia Lab Results  Component Value Date   HGBA1C 5.5 08/23/2021   Stable, pt to continue current medical treatment  - diet, wt control, excercise   Hyperlipidemia Lab Results  Component Value Date   LDLCALC 63 08/23/2021   Stable, pt to continue current statin zocor 40 mg qd  Followup: Return if symptoms worsen or fail to  improve.  [name], MD 01/23/2022 9:16 AM [facility]

## 2022-01-23 NOTE — Assessment & Plan Note (Signed)
Lab Results  Component Value Date   LDLCALC 63 08/23/2021   Stable, pt to continue current statin zocor 40 mg qd

## 2022-01-23 NOTE — Patient Instructions (Signed)
Please check your COVID testing once more tomorrow, and let us know if you are positive  Please take all new medication as prescribed - the antibiotic, cough medicine as needed  Please continue all other medications as before, and refills have been done if requested.  Please have the pharmacy call with any other refills you may need.  Please continue your efforts at being more active, low cholesterol diet, and weight control.  Please keep your appointments with your specialists as you may have planned

## 2022-01-23 NOTE — Telephone Encounter (Signed)
If not too expensive, that is the best one, let me know if he wants it changed

## 2022-01-23 NOTE — Assessment & Plan Note (Signed)
Lab Results  Component Value Date   HGBA1C 5.5 08/23/2021   Stable, pt to continue current medical treatment  - diet, wt control, excercise

## 2022-01-23 NOTE — Telephone Encounter (Signed)
Patients insurance will not cover the Hycodan syrup, please send another alternative. If not, patient said that he would pick it up anyway.

## 2022-01-23 NOTE — Assessment & Plan Note (Signed)
Mild to mod, for antibx course zpack, and cough med prn,  to f/u any worsening symptoms or concerns, also to recheck COVID testing tomorrow to be certain

## 2022-01-23 NOTE — Telephone Encounter (Signed)
Please call patient back - one of his medications from this morning's appointment is not covered.

## 2022-01-29 DIAGNOSIS — Z79899 Other long term (current) drug therapy: Secondary | ICD-10-CM | POA: Diagnosis not present

## 2022-01-30 ENCOUNTER — Other Ambulatory Visit: Payer: Self-pay | Admitting: Podiatry

## 2022-01-30 DIAGNOSIS — R748 Abnormal levels of other serum enzymes: Secondary | ICD-10-CM

## 2022-01-30 LAB — HEPATIC FUNCTION PANEL
AG Ratio: 1.8 (calc) (ref 1.0–2.5)
ALT: 70 U/L — ABNORMAL HIGH (ref 9–46)
AST: 56 U/L — ABNORMAL HIGH (ref 10–35)
Albumin: 4.6 g/dL (ref 3.6–5.1)
Alkaline phosphatase (APISO): 88 U/L (ref 35–144)
Bilirubin, Direct: 0.1 mg/dL (ref 0.0–0.2)
Globulin: 2.6 g/dL (calc) (ref 1.9–3.7)
Indirect Bilirubin: 0.5 mg/dL (calc) (ref 0.2–1.2)
Total Bilirubin: 0.6 mg/dL (ref 0.2–1.2)
Total Protein: 7.2 g/dL (ref 6.1–8.1)

## 2022-01-30 LAB — CBC WITH DIFFERENTIAL/PLATELET
Absolute Monocytes: 347 cells/uL (ref 200–950)
Basophils Absolute: 28 cells/uL (ref 0–200)
Basophils Relative: 0.5 %
Eosinophils Absolute: 90 cells/uL (ref 15–500)
Eosinophils Relative: 1.6 %
HCT: 45.6 % (ref 38.5–50.0)
Hemoglobin: 15.6 g/dL (ref 13.2–17.1)
Lymphs Abs: 1652 cells/uL (ref 850–3900)
MCH: 32.2 pg (ref 27.0–33.0)
MCHC: 34.2 g/dL (ref 32.0–36.0)
MCV: 94 fL (ref 80.0–100.0)
MPV: 10.4 fL (ref 7.5–12.5)
Monocytes Relative: 6.2 %
Neutro Abs: 3483 cells/uL (ref 1500–7800)
Neutrophils Relative %: 62.2 %
Platelets: 187 10*3/uL (ref 140–400)
RBC: 4.85 10*6/uL (ref 4.20–5.80)
RDW: 12.1 % (ref 11.0–15.0)
Total Lymphocyte: 29.5 %
WBC: 5.6 10*3/uL (ref 3.8–10.8)

## 2022-02-01 ENCOUNTER — Encounter: Payer: Self-pay | Admitting: Internal Medicine

## 2022-02-01 ENCOUNTER — Ambulatory Visit (INDEPENDENT_AMBULATORY_CARE_PROVIDER_SITE_OTHER): Payer: Medicare HMO | Admitting: Internal Medicine

## 2022-02-01 VITALS — BP 122/64 | HR 68 | Temp 98.2°F | Ht 72.0 in | Wt 174.0 lb

## 2022-02-01 DIAGNOSIS — J309 Allergic rhinitis, unspecified: Secondary | ICD-10-CM

## 2022-02-01 DIAGNOSIS — H6592 Unspecified nonsuppurative otitis media, left ear: Secondary | ICD-10-CM

## 2022-02-01 DIAGNOSIS — H68103 Unspecified obstruction of Eustachian tube, bilateral: Secondary | ICD-10-CM

## 2022-02-01 MED ORDER — GUAIFENESIN ER 600 MG PO TB12
1200.0000 mg | ORAL_TABLET | Freq: Two times a day (BID) | ORAL | 1 refills | Status: DC | PRN
Start: 2022-02-01 — End: 2022-08-29

## 2022-02-01 MED ORDER — DOXYCYCLINE HYCLATE 100 MG PO TABS: 100.0000 mg | ORAL_TABLET | Freq: Two times a day (BID) | ORAL | 0 refills | Status: DC

## 2022-02-01 NOTE — Progress Notes (Signed)
Patient ID: JAE SKEET, male   DOB: 1951/03/08, 71 y.o.   MRN: [id number]        Chief Complaint: follow up left ear pain       HPI:  Danny Young is a 71 y.o. male here with recent URI symptoms, covid neg x 2, some improved with zpack, then worsening left ear shooting sharp occasinoally severe pains and muffled hearing.  Sinuses seem improved.  Pt denies chest pain, increased sob or doe, wheezing, orthopnea, PND, increased LE swelling, palpitations, dizziness or syncope.   Pt denies polydipsia, polyuria, or new focal neuro s/s. Does have several wks ongoing nasal allergy symptoms with clearish congestion, itch and sneezing, without fever, pain, ST, cough, swelling or wheezing.        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 He has never used smokeless tobacco. He reports current alcohol use. He reports that he does not use drugs. family history includes Hypertension in his father and mother. No Known Allergies Current Outpatient Medications on File Prior to Visit  Medication Sig Dispense Refill   Ascorbic Acid (VITAMIN C) 500 MG CAPS  Take 2 tablets by mouth daily.     aspirin 81 MG chewable tablet Chew 81 mg by mouth daily.      HYDROcodone bit-homatropine (HYCODAN) 5-1.5 MG/5ML syrup Take 5 mLs by mouth every 6 (six) hours as needed for up to 10 days. 180 mL 0   ibuprofen (ADVIL) 200 MG tablet Take 600 mg by mouth every 6 (six) hours as needed for fever or headache.     montelukast (SINGULAIR) 10 MG tablet TAKE 1 TABLET BY MOUTH EVERYDAY AT BEDTIME 90 tablet 3   Multiple Vitamin (MULTIVITAMIN) capsule Take 1 capsule by mouth daily.     SHINGRIX injection      simvastatin (ZOCOR) 40 MG tablet TAKE 1 TABLET BY MOUTH EVERYDAY AT BEDTIME 90 tablet 3   terbinafine (LAMISIL) 250 MG tablet Take 1 tablet (250 mg total) by mouth daily. 90 tablet 0   triamcinolone (NASACORT) 55 MCG/ACT AERO nasal inhaler Place 2 sprays into the nose daily. 1 Inhaler 12   No current facility-administered medications on file prior to visit.        ROS:  All others reviewed and negative.  Objective        PE:  BP 122/64 (BP Location: Right Arm, Patient Position: Sitting, Cuff Size:  Large)   Pulse 68   Temp 98.2 F (36.8 C) (Oral)   Ht 6' (1.829 m)   Wt 174 lb (78.9 kg)   SpO2 94%   BMI 23.60 kg/m                 Constitutional: Pt appears in NAD               HENT: Head: NCAT.                Right Ear: External ear normal.                 Left Ear: External ear normal.  Bilat tm's with mod erythema and left effusion.  Max sinus areas non tender.  Pharynx with mild erythema, no exudate               Eyes: . Pupils are equal, round, and reactive to light. Conjunctivae and EOM are normal               Nose: without d/c or deformity               Neck: Neck supple. Gross normal ROM               Cardiovascular: Normal rate and regular rhythm.                 Pulmonary/Chest: Effort normal and breath sounds without rales or wheezing.                              Neurological: Pt is alert. At baseline orientation, motor grossly intact                Skin: Skin is warm. No rashes, no other new lesions, LE edema - none               Psychiatric: Pt behavior is normal without agitation   Micro: none  Cardiac tracings I have personally interpreted today:  none  Pertinent Radiological findings (summarize): none   Lab Results  Component Value Date   WBC 5.6 01/29/2022   HGB 15.6 01/29/2022   HCT 45.6 01/29/2022   PLT 187 01/29/2022   GLUCOSE 97 08/23/2021   CHOL 114 08/23/2021   TRIG 52.0 08/23/2021   HDL 40.20 08/23/2021   LDLDIRECT 84.1 01/06/2007   LDLCALC 63 08/23/2021   ALT 70 (H) 01/29/2022   AST 56 (H) 01/29/2022   NA 139 08/23/2021   K 5.3 No hemolysis seen (H) 08/23/2021   CL 104 08/23/2021   CREATININE 1.10 08/23/2021   BUN 27 (H) 08/23/2021   CO2 27 08/23/2021   TSH 1.18 08/23/2021   PSA 2.47 08/23/2021   INR 1.0 ratio 07/13/2009   HGBA1C 5.5 08/23/2021   Assessment/Plan:  Danny Young is a 71 y.o. White or Caucasian [1] male with  has a past medical history of ALLERGIC RHINITIS (01/19/2007), Allergy, Cataract, Cervical spine degeneration (10/22/2011), GERD (01/19/2007), HYPERLIPIDEMIA (01/19/2007), MIGRAINE HEADACHE (01/19/2007), Sleep apnea, and WOLFF (WOLFE)-PARKINSON-WHITE (WPW) SYNDROME (01/19/2007).  Left otitis media with effusion Mild to mod, for antibx course,  to f/u any worsening symptoms or concerns  Blocked Eustachian tube, bilateral Also for mucinex bid prn,  to f/u any worsening symptoms or concerns  Allergic rhinitis Mild to mod, for restart nasacort asd,  to f/u any worsening symptoms  Followup: Return if symptoms worsen or fail to improve.  [name],  MD 02/01/2022 1:58 PM [facility]

## 2022-02-01 NOTE — Assessment & Plan Note (Signed)
Mild to mod, for antibx course,  to f/u any worsening symptoms or concerns 

## 2022-02-01 NOTE — Assessment & Plan Note (Signed)
Also for mucinex bid prn,  to f/u any worsening symptoms or concerns

## 2022-02-01 NOTE — Assessment & Plan Note (Signed)
Mild to mod, for restart nasacort asd,  to f/u any worsening symptoms

## 2022-02-01 NOTE — Patient Instructions (Signed)
Please take all new medication as prescribed - the antibiotic, and mucinex twice per day until improved  Please continue all other medications as before, and refills have been done if requested.  Please have the pharmacy call with any other refills you may need.  Please keep your appointments with your specialists as you may have planned

## 2022-02-18 DIAGNOSIS — R748 Abnormal levels of other serum enzymes: Secondary | ICD-10-CM | POA: Diagnosis not present

## 2022-02-19 LAB — HEPATIC FUNCTION PANEL
AG Ratio: 1.7 (calc) (ref 1.0–2.5)
ALT: 23 U/L (ref 9–46)
AST: 24 U/L (ref 10–35)
Albumin: 4.5 g/dL (ref 3.6–5.1)
Alkaline phosphatase (APISO): 80 U/L (ref 35–144)
Bilirubin, Direct: 0.1 mg/dL (ref 0.0–0.2)
Globulin: 2.6 g/dL (calc) (ref 1.9–3.7)
Indirect Bilirubin: 0.6 mg/dL (calc) (ref 0.2–1.2)
Total Bilirubin: 0.7 mg/dL (ref 0.2–1.2)
Total Protein: 7.1 g/dL (ref 6.1–8.1)

## 2022-02-26 ENCOUNTER — Other Ambulatory Visit: Payer: Self-pay | Admitting: Podiatry

## 2022-02-26 ENCOUNTER — Telehealth: Payer: Self-pay | Admitting: *Deleted

## 2022-02-26 MED ORDER — CICLOPIROX 8 % EX SOLN: Freq: Every day | CUTANEOUS | 2 refills | Status: DC

## 2022-02-26 NOTE — Telephone Encounter (Signed)
Patient is return call concerning results given and he has decided to go with the topical for the nails, looks like they are starting to clear up as well.

## 2022-03-28 ENCOUNTER — Ambulatory Visit: Payer: Medicare HMO | Admitting: Podiatry

## 2022-03-28 DIAGNOSIS — B351 Tinea unguium: Secondary | ICD-10-CM

## 2022-03-28 NOTE — Patient Instructions (Signed)
You can start a BIOTIN supplement or a vitamin that is for HAIR, SKIN, AND NAILS  Ciclopirox Topical Solution What is this medication? CICLOPIROX (sye kloe PEER ox) treats fungal infections of the nails. It belongs to a group of medications called antifungals. It will not treat infections caused by bacteria or viruses. This medicine may be used for other purposes; ask your health care provider or pharmacist if you have questions. COMMON BRAND NAME(S): Ciclodan Nail Solution, CNL8, Penlac What should I tell my care team before I take this medication? They need to know if you have any of these conditions: Diabetes (high blood sugar) Immune system problems Organ transplant Receiving steroid inhalers, cream, or lotion Seizures Tingling of the fingers or toes or other nerve disorder An unusual or allergic reaction to ciclopirox, other medications, foods, dyes, or preservatives Pregnant or trying to get pregnant Breast-feeding How should I use this medication? This medication is for external use only. Do not take by mouth. Wash your hands before and after use. If you are treating your hands, only wash your hands before use. Do not get it in your eyes. If you do, rinse your eyes with plenty of cool tap water. Use it as directed on the prescription label at the same time every day. Do not use it more often than directed. Use the medication for the full course as directed by your care team, even if you think you are better. Do not stop using it unless your care team tells you to stop it early. Apply a thin film of the medication to the affected area. Talk to your care team about the use of this medication in children. While it may be prescribed for children as young as 12 years for selected conditions, precautions do apply. Overdosage: If you think you have taken too much of this medicine contact a poison control center or emergency room at once. NOTE: This medicine is only for you. Do not share this  medicine with others. What if I miss a dose? If you miss a dose, use it as soon as you can. If it is almost time for your next dose, use only that dose. Do not use double or extra doses. What may interact with this medication? Interactions are not expected. Do not use any other skin products without telling your care team. This list may not describe all possible interactions. Give your health care provider a list of all the medicines, herbs, non-prescription drugs, or dietary supplements you use. Also tell them if you smoke, drink alcohol, or use illegal drugs. Some items may interact with your medicine. What should I watch for while using this medication? Visit your care team for regular checks on your progress. It may be some time before you see the benefit from this medication. Do not use nail polish or other nail cosmetic products on the treated nails. Removal of the unattached, infected nail by your care team is needed with use of this medication. If you have diabetes or numbness in your fingers or toes, talk to your care team about proper nail care. What side effects may I notice from receiving this medication? Side effects that you should report to your care team as soon as possible: Allergic reactions--skin rash, itching, hives, swelling of the face, lips, tongue, or throat Burning, itching, crusting, or peeling of treated skin Side effects that usually do not require medical attention (report to your care team if they continue or are bothersome): Change in nail shape,  thickness, or color Mild skin irritation, redness, or dryness This list may not describe all possible side effects. Call your doctor for medical advice about side effects. You may report side effects to FDA at 1-800-FDA-1088. Where should I keep my medication? Keep out of the reach of children and pets. Store at room temperature between 20 and 25 degrees C (68 and 77 degrees F). This medication is flammable. Avoid exposure  to heat, fire, flame, and smoking. Get rid of medications that are no longer needed or have expired: Take the medication to a medication take-back program. Check with your pharmacy or law enforcement to find a location. If you cannot return the medication, check the label or package insert to see if the medication should be thrown out in the garbage or flushed down the toilet. If you are not sure, ask your care team. If it is safe to put in the trash, take the medication out of the container. Mix the medication with cat litter, dirt, coffee grounds, or other unwanted substance. Seal the mixture in a bag or container. Put it in the trash. NOTE: This sheet is a summary. It may not cover all possible information. If you have questions about this medicine, talk to your doctor, pharmacist, or health care provider.  2023 Elsevier/Gold Standard (2021-03-27 00:00:00)

## 2022-03-28 NOTE — Progress Notes (Signed)
Subjective: Chief Complaint  Patient presents with   Nail Problem    Routine foot car, nail trim and callus     He states that the nails are getting better and he is still using the Penlac, but not sure if that is helping much. The Lamisil was helping but unfortuenly this affected his liver function.  No drainage or pus from the toenail sites.   Objective: AAO x3, NAD DP/PT pulses palpable bilaterally, CRT less than 3 seconds There is clearing along the lesser digit nails mostly but the nails still have yellow, red discoloration hypertrophic, dystrophic.  There is no significant pain in the nails today there is no swelling redness or any drainage.  No open lesions. No pain with calf compression, swelling, warmth, erythema  Assessment: Onychomycosis  Plan: -All treatment options discussed with the patient including all alternatives, risks, complications.  -As a courtesy debride the nails without any complications or bleeding.  He is going to continue with Penlac, discussed switch to other topicals such as a compounded medicaiton.  -Patient encouraged to call the office with any questions, concerns, change in symptoms.   Trula Slade DPM

## 2022-03-31 DIAGNOSIS — B351 Tinea unguium: Secondary | ICD-10-CM | POA: Insufficient documentation

## 2022-04-01 ENCOUNTER — Ambulatory Visit (INDEPENDENT_AMBULATORY_CARE_PROVIDER_SITE_OTHER): Payer: Medicare HMO

## 2022-04-01 ENCOUNTER — Encounter: Payer: Self-pay | Admitting: Internal Medicine

## 2022-04-01 ENCOUNTER — Ambulatory Visit (INDEPENDENT_AMBULATORY_CARE_PROVIDER_SITE_OTHER): Payer: Medicare HMO | Admitting: Internal Medicine

## 2022-04-01 ENCOUNTER — Telehealth: Payer: Self-pay | Admitting: Internal Medicine

## 2022-04-01 VITALS — BP 128/80 | HR 58 | Temp 98.2°F | Ht 73.0 in | Wt 176.0 lb

## 2022-04-01 DIAGNOSIS — R053 Chronic cough: Secondary | ICD-10-CM

## 2022-04-01 DIAGNOSIS — R059 Cough, unspecified: Secondary | ICD-10-CM | POA: Diagnosis not present

## 2022-04-01 MED ORDER — PANTOPRAZOLE SODIUM 40 MG PO TBEC: 40.0000 mg | DELAYED_RELEASE_TABLET | Freq: Every day | ORAL | 0 refills | Status: DC

## 2022-04-01 MED ORDER — BENZONATATE 100 MG PO CAPS: ORAL_CAPSULE | ORAL | 0 refills | Status: DC

## 2022-04-01 MED ORDER — BENZONATATE 200 MG PO CAPS: 200.0000 mg | ORAL_CAPSULE | Freq: Three times a day (TID) | ORAL | 0 refills | Status: DC | PRN

## 2022-04-01 NOTE — Telephone Encounter (Signed)
PT calls today post-visit with Dr.Crawford in regards to new RX. PT was prescribed benzonatate (TESSALON) 200 MG capsule  and when getting up with their pharmacy as well as their insurance they have found that they would not be covering any of the cost. PT can not afford this and was wanting to know if an alternative could be ordered.  CB: [phone]

## 2022-04-01 NOTE — Patient Instructions (Signed)
We will check the x-ray today.   We have sent in protonix for the heartburn to take daily in the morning for 2-4 weeks.  The tessalon perles we have sent in to take up to 3 times a day to help with the cough.

## 2022-04-01 NOTE — Assessment & Plan Note (Signed)
Suspect GERD induced given symptoms of that lately. Rx protonix 40 mg daily for 2-4 weeks. Rx tessalon perles to help reduce coughing. Checking CXR to rule out underlying structural issue.

## 2022-04-01 NOTE — Telephone Encounter (Signed)
Ok to try the lower dose tessalon 100 mg as I think this might be better covered by the insurance - done erx

## 2022-04-01 NOTE — Progress Notes (Signed)
   Subjective:   Patient ID: Danny Young, male    DOB: [birth year], 71 y.o.   MRN: [id number]  HPI The patient is a 71 YO man coming in for cough and has taken 2 different antibiotics since Sept most recent doxycycline beginning Oct. Cough is improving and mostly non-productive now. No fevers or chills or weight loss or nasal drip. Does have GERD and some symptoms of that lately.   Review of Systems  Constitutional: Negative.   HENT: Negative.    Eyes: Negative.   Respiratory:  Positive for cough. Negative for chest tightness and shortness of breath.   Cardiovascular:  Negative for chest pain, palpitations and leg swelling.  Gastrointestinal:  Negative for abdominal distention, abdominal pain, constipation, diarrhea, nausea and vomiting.  Musculoskeletal: Negative.   Skin: Negative.   Neurological: Negative.   Psychiatric/Behavioral: Negative.      Objective:  Physical Exam Constitutional:      Appearance: He is well-developed.  HENT:     Head: Normocephalic and atraumatic.  Cardiovascular:     Rate and Rhythm: Normal rate and regular rhythm.  Pulmonary:     Effort: Pulmonary effort is normal. No respiratory distress.     Breath sounds: Normal breath sounds. No wheezing or rales.  Abdominal:     General: Bowel sounds are normal. There is no distension.     Palpations: Abdomen is soft.     Tenderness: There is no abdominal tenderness. There is no rebound.  Musculoskeletal:     Cervical back: Normal range of motion.  Skin:    General: Skin is warm and dry.  Neurological:     Mental Status: He is alert and oriented to person, place, and time.     Coordination: Coordination normal.     Vitals:   04/01/22 0935  BP: 128/80  Pulse: (!) 58  Temp: 98.2 F (36.8 C)  TempSrc: Oral  SpO2: 97%  Weight: 176 lb (79.8 kg)  Height: '6\' 1"'$  (1.854 m)    Assessment & Plan:  Visit time 20 minutes in face to face communication with patient and coordination of care, additional  10 minutes spent in record review, coordination or care, ordering tests, communicating/referring to other healthcare professionals, documenting in medical records all on the same day of the visit for total time 30 minutes spent on the visit.

## 2022-04-16 DIAGNOSIS — H5203 Hypermetropia, bilateral: Secondary | ICD-10-CM | POA: Diagnosis not present

## 2022-05-15 DIAGNOSIS — L578 Other skin changes due to chronic exposure to nonionizing radiation: Secondary | ICD-10-CM | POA: Diagnosis not present

## 2022-05-15 DIAGNOSIS — Z85828 Personal history of other malignant neoplasm of skin: Secondary | ICD-10-CM | POA: Diagnosis not present

## 2022-05-15 DIAGNOSIS — L570 Actinic keratosis: Secondary | ICD-10-CM | POA: Diagnosis not present

## 2022-05-15 DIAGNOSIS — L820 Inflamed seborrheic keratosis: Secondary | ICD-10-CM | POA: Diagnosis not present

## 2022-05-15 DIAGNOSIS — L821 Other seborrheic keratosis: Secondary | ICD-10-CM | POA: Diagnosis not present

## 2022-06-06 DIAGNOSIS — H25813 Combined forms of age-related cataract, bilateral: Secondary | ICD-10-CM | POA: Diagnosis not present

## 2022-06-06 DIAGNOSIS — H5213 Myopia, bilateral: Secondary | ICD-10-CM | POA: Diagnosis not present

## 2022-06-12 DIAGNOSIS — Z0100 Encounter for examination of eyes and vision without abnormal findings: Secondary | ICD-10-CM | POA: Diagnosis not present

## 2022-08-26 ENCOUNTER — Encounter: Payer: Self-pay | Admitting: Internal Medicine

## 2022-08-26 ENCOUNTER — Ambulatory Visit (INDEPENDENT_AMBULATORY_CARE_PROVIDER_SITE_OTHER): Payer: Medicare HMO | Admitting: Internal Medicine

## 2022-08-26 ENCOUNTER — Ambulatory Visit (INDEPENDENT_AMBULATORY_CARE_PROVIDER_SITE_OTHER): Payer: Medicare HMO

## 2022-08-26 VITALS — BP 130/78 | HR 50 | Temp 98.9°F | Ht 73.0 in | Wt 174.0 lb

## 2022-08-26 DIAGNOSIS — G8929 Other chronic pain: Secondary | ICD-10-CM

## 2022-08-26 DIAGNOSIS — Z125 Encounter for screening for malignant neoplasm of prostate: Secondary | ICD-10-CM

## 2022-08-26 DIAGNOSIS — M545 Low back pain, unspecified: Secondary | ICD-10-CM | POA: Diagnosis not present

## 2022-08-26 DIAGNOSIS — Z0001 Encounter for general adult medical examination with abnormal findings: Secondary | ICD-10-CM

## 2022-08-26 DIAGNOSIS — E538 Deficiency of other specified B group vitamins: Secondary | ICD-10-CM

## 2022-08-26 DIAGNOSIS — E7800 Pure hypercholesterolemia, unspecified: Secondary | ICD-10-CM

## 2022-08-26 DIAGNOSIS — R739 Hyperglycemia, unspecified: Secondary | ICD-10-CM

## 2022-08-26 DIAGNOSIS — M25561 Pain in right knee: Secondary | ICD-10-CM | POA: Insufficient documentation

## 2022-08-26 DIAGNOSIS — M4316 Spondylolisthesis, lumbar region: Secondary | ICD-10-CM | POA: Diagnosis not present

## 2022-08-26 DIAGNOSIS — I700 Atherosclerosis of aorta: Secondary | ICD-10-CM | POA: Diagnosis not present

## 2022-08-26 DIAGNOSIS — M48061 Spinal stenosis, lumbar region without neurogenic claudication: Secondary | ICD-10-CM | POA: Diagnosis not present

## 2022-08-26 DIAGNOSIS — E559 Vitamin D deficiency, unspecified: Secondary | ICD-10-CM | POA: Diagnosis not present

## 2022-08-26 LAB — BASIC METABOLIC PANEL
BUN: 25 mg/dL — ABNORMAL HIGH (ref 6–23)
CO2: 29 mEq/L (ref 19–32)
Calcium: 9.8 mg/dL (ref 8.4–10.5)
Chloride: 104 mEq/L (ref 96–112)
Creatinine, Ser: 1.1 mg/dL (ref 0.40–1.50)
GFR: 67.82 mL/min (ref >60.00)
Glucose, Bld: 96 mg/dL (ref 70–99)
Potassium: 4.7 mEq/L (ref 3.5–5.1)
Sodium: 142 mEq/L (ref 135–145)

## 2022-08-26 LAB — VITAMIN B12: Vitamin B-12: 431 pg/mL (ref 211–911)

## 2022-08-26 LAB — URINALYSIS, ROUTINE W REFLEX MICROSCOPIC
Bilirubin Urine: NEGATIVE
Hgb urine dipstick: NEGATIVE
Ketones, ur: NEGATIVE
Leukocytes,Ua: NEGATIVE
Nitrite: NEGATIVE
RBC / HPF: NONE SEEN (ref 0–?)
Specific Gravity, Urine: 1.025 (ref 1.000–1.030)
Total Protein, Urine: NEGATIVE
Urine Glucose: NEGATIVE
Urobilinogen, UA: 0.2 (ref 0.0–1.0)
pH: 6 (ref 5.0–8.0)

## 2022-08-26 LAB — CBC WITH DIFFERENTIAL/PLATELET
Basophils Absolute: 0 10*3/uL (ref 0.0–0.1)
Basophils Relative: 0.4 % (ref 0.0–3.0)
Eosinophils Absolute: 0.1 10*3/uL (ref 0.0–0.7)
Eosinophils Relative: 1.6 % (ref 0.0–5.0)
HCT: 48.8 % (ref 39.0–52.0)
Hemoglobin: 16.5 g/dL (ref 13.0–17.0)
Lymphocytes Relative: 21.2 % (ref 12.0–46.0)
Lymphs Abs: 1.6 10*3/uL (ref 0.7–4.0)
MCHC: 33.8 g/dL (ref 30.0–36.0)
MCV: 95.5 fl (ref 78.0–100.0)
Monocytes Absolute: 0.6 10*3/uL (ref 0.1–1.0)
Monocytes Relative: 7.7 % (ref 3.0–12.0)
Neutro Abs: 5.2 10*3/uL (ref 1.4–7.7)
Neutrophils Relative %: 69.1 % (ref 43.0–77.0)
Platelets: 182 10*3/uL (ref 150.0–400.0)
RBC: 5.11 Mil/uL (ref 4.22–5.81)
RDW: 12.4 % (ref 11.5–15.5)
WBC: 7.5 10*3/uL (ref 4.0–10.5)

## 2022-08-26 LAB — VITAMIN D 25 HYDROXY (VIT D DEFICIENCY, FRACTURES): VITD: 38.86 ng/mL (ref 30.00–100.00)

## 2022-08-26 LAB — LIPID PANEL
Cholesterol: 116 mg/dL (ref 0–200)
HDL: 35 mg/dL — ABNORMAL LOW (ref >39.00)
LDL Cholesterol: 59 mg/dL (ref 0–99)
NonHDL: 80.58
Total CHOL/HDL Ratio: 3
Triglycerides: 108 mg/dL (ref 0.0–149.0)
VLDL: 21.6 mg/dL (ref 0.0–40.0)

## 2022-08-26 LAB — TSH: TSH: 1.28 u[IU]/mL (ref 0.35–5.50)

## 2022-08-26 LAB — HEPATIC FUNCTION PANEL
ALT: 24 U/L (ref 0–53)
AST: 26 U/L (ref 0–37)
Albumin: 4.8 g/dL (ref 3.5–5.2)
Alkaline Phosphatase: 80 U/L (ref 39–117)
Bilirubin, Direct: 0.1 mg/dL (ref 0.0–0.3)
Total Bilirubin: 0.7 mg/dL (ref 0.2–1.2)
Total Protein: 7.4 g/dL (ref 6.0–8.3)

## 2022-08-26 LAB — PSA: PSA: 2.68 ng/mL (ref 0.10–4.00)

## 2022-08-26 LAB — HEMOGLOBIN A1C: Hgb A1c MFr Bld: 5.7 % (ref 4.6–6.5)

## 2022-08-26 NOTE — Patient Instructions (Signed)
Please continue all other medications as before, and refills have been done if requested.  Please have the pharmacy call with any other refills you may need.  Please continue your efforts at being more active, low cholesterol diet, and weight control.  You are otherwise up to date with prevention measures today.  Please keep your appointments with your specialists as you may have planned  You will be contacted regarding the referral for: sports medicine  Please go to the XRAY Department in the first floor for the x-ray testing  You will be contacted by phone if any changes need to be made immediately.  Otherwise, you will receive a letter about your results with an explanation, but please check with MyChart first.  Please remember to sign up for MyChart if you have not done so, as this will be important to you in the future with finding out test results, communicating by private email, and scheduling acute appointments online when needed.  Please make an Appointment to return for your 1 year visit, or sooner if needed

## 2022-08-26 NOTE — Progress Notes (Signed)
The test results show that your current treatment is OK, as the tests are stable.  Please continue the same plan.  There is no other need for change of treatment or further evaluation based on these results, at this time.  thanks 

## 2022-08-26 NOTE — Progress Notes (Unsigned)
Patient ID: Danny Young, male   DOB: 1950/08/26, 72 y.o.   MRN: [id number]         Chief Complaint:: wellness exam and left lower back pain, right knee near giveaways, aortic atherosclerosis, hyperglycemia, hld       HPI:  Danny Young is a 72 y.o. male here for wellness exam; declines covid booster, o/w up to date                        Also has 2 mo persistent recurring right knee pain with recent freqeuent giveaway attempts, no falls or swelling.  Pt denies chest pain, increased sob or doe, wheezing, orthopnea, PND, increased LE swelling, palpitations, dizziness or syncope.   Pt denies polydipsia, polyuria, or new focal neuro s/s.    Pt denies fever, wt loss, night sweats, loss of appetite, or other constitutional symptoms  Pt continues to have recurring LBP without change in severity, bowel or bladder change, fever, wt loss,  worsening LE pain/numbness/weakness, gait change or falls, but has had recurring pain x 15 yrs now more frequent, now severe with gardening    Wt Readings from Last 3 Encounters:  08/29/22 175 lb (79.4 kg)  08/26/22 174 lb (78.9 kg)  04/01/22 176 lb (79.8 kg)   BP Readings from Last 3 Encounters:  08/29/22 (!) 140/80  08/26/22 130/78  04/01/22 128/80   Immunization History  Administered Date(s) Administered   Fluad Quad(high Dose 65+) 01/11/2019, 01/21/2022   Influenza Split 01/20/2017   Influenza Whole 06/06/2009, 02/07/2010   Influenza, High Dose Seasonal PF 12/26/2016, 02/10/2018, 03/01/2021   Influenza,inj,Quad PF,6+ Mos 01/06/2013, 05/25/2014   Moderna Covid-19 Vaccine Bivalent Booster 54yrs & up 02/27/2022   Moderna Sars-Covid-2 Vaccination 05/25/2019, 06/22/2019, 03/04/2020   Pfizer Covid-19 Vaccine Bivalent Booster 6yrs & up 02/12/2021, 12/18/2021   Pneumococcal Conjugate-13 04/08/2018   Pneumococcal Polysaccharide-23 08/24/2019   Td 06/06/2009   Tdap 08/24/2019   Zoster Recombinat (Shingrix) 10/19/2021, 12/25/2021   Zoster, Live  10/22/2011   Health Maintenance Due  Topic Date Due   Medicare Annual Wellness (AWV)  Never done      Past Medical History:  Diagnosis Date   ALLERGIC RHINITIS 01/19/2007   Qualifier: Diagnosis of  By: Jonny Ruiz MD, Len Blalock    Allergy    seasonal   Cataract    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 He has never used smokeless tobacco. He reports current alcohol use. He reports that he does not use drugs. family history includes Hypertension in his father and mother. No Known Allergies Current Outpatient Medications on File Prior to Visit  Medication Sig Dispense Refill   Ascorbic Acid (VITAMIN C) 500 MG CAPS Take 2 tablets by mouth daily.     aspirin 81 MG chewable tablet Chew 81 mg by mouth daily.      ciclopirox (PENLAC) 8 %  solution Apply topically at bedtime. Apply over nail and surrounding skin. Apply daily over previous coat. After seven (7) days, may remove with alcohol and continue cycle. 6.6 mL 2   ibuprofen (ADVIL) 200 MG tablet Take 600 mg by mouth every 6 (six) hours as needed for fever or headache.     montelukast (SINGULAIR) 10 MG tablet TAKE 1 TABLET BY MOUTH EVERYDAY AT BEDTIME 90 tablet 3   Multiple Vitamin (MULTIVITAMIN) capsule Take 1 capsule by mouth daily.     pantoprazole (PROTONIX) 40 MG tablet Take 1 tablet (40 mg total) by mouth daily. 30 tablet 0   simvastatin (ZOCOR) 40 MG tablet TAKE 1 TABLET BY MOUTH EVERYDAY AT BEDTIME 90 tablet 3   triamcinolone (NASACORT) 55 MCG/ACT AERO nasal  inhaler Place 2 sprays into the nose daily. 1 Inhaler 12   No current facility-administered medications on file prior to visit.        ROS:  All others reviewed and negative.  Objective        PE:  BP 130/78 (BP Location: Right Arm, Patient Position: Sitting, Cuff Size: Normal)   Pulse (!) 50   Temp 98.9 F (37.2 C) (Oral)   Ht 6\' 1"  (1.854 m)   Wt 174 lb (78.9 kg)   SpO2 100%   BMI 22.96 kg/m                 Constitutional: Pt appears in NAD               HENT: Head: NCAT.                Right Ear: External ear normal.                 Left Ear: External ear normal.                Eyes: . Pupils are equal, round, and reactive to light. Conjunctivae and EOM are normal               Nose: without d/c or deformity               Neck: Neck supple. Gross normal ROM               Cardiovascular: Normal rate and regular rhythm.                 Pulmonary/Chest: Effort normal and breath sounds without rales or wheezing.                Abd:  Soft, NT, ND, + BS, no organomegaly               Neurological: Pt is alert. At baseline orientation, motor grossly intact, spine nontender, right knee with FROM, Ntno effusion               Skin: Skin is warm. No rashes, no other new lesions, LE edema - none               Psychiatric: Pt behavior is normal without agitation   Micro: none  Cardiac tracings I have personally interpreted today:  none  Pertinent Radiological findings (summarize): none   Lab Results  Component Value Date   WBC 7.5 08/26/2022   HGB 16.5 08/26/2022   HCT 48.8 08/26/2022   PLT 182.0 08/26/2022   GLUCOSE 96 08/26/2022   CHOL 116 08/26/2022   TRIG 108.0 08/26/2022   HDL 35.00 (L) 08/26/2022   LDLDIRECT 84.1 01/06/2007  LDLCALC 59 08/26/2022   ALT 24 08/26/2022   AST 26 08/26/2022   NA 142 08/26/2022   K 4.7 08/26/2022   CL 104 08/26/2022   CREATININE 1.10 08/26/2022   BUN 25 (H) 08/26/2022   CO2 29 08/26/2022   TSH 1.28 08/26/2022   PSA 2.68 08/26/2022    INR 1.0 ratio 07/13/2009   HGBA1C 5.7 08/26/2022   Assessment/Plan:  Danny Young is a 72 y.o. White or Caucasian [1] male with  has a past medical history of ALLERGIC RHINITIS (01/19/2007), Allergy, Cataract, Cervical spine degeneration (10/22/2011), GERD (01/19/2007), HYPERLIPIDEMIA (01/19/2007), MIGRAINE HEADACHE (01/19/2007), Sleep apnea, and WOLFF (WOLFE)-PARKINSON-WHITE (WPW) SYNDROME (01/19/2007).  Aortic atherosclerosis (HCC) Pt to continue low chol diet, excercise, low chol diet  Encounter for well adult exam with abnormal findings Age and sex appropriate education and counseling updated with regular exercise and diet Referrals for preventative services - none needed Immunizations addressed - declines covid booster Smoking counseling  - none needed Evidence for depression or other mood disorder - none significant Most recent labs reviewed. I have personally reviewed and have noted: 1) the patient's medical and social history 2) The patient's current medications and supplements 3) The patient's height, weight, and BMI have been recorded in the chart   Hyperglycemia Lab Results  Component Value Date   HGBA1C 5.7 08/26/2022   Stable, pt to continue current medical treatment  - diet, wt control   Hyperlipidemia Lab Results  Component Value Date   LDLCALC 59 08/26/2022   Stable, pt to continue current statin zocor 40 mg qd  Low back pain No sciatica or neuro change, declines tramadol for now, for plain xrays today, consider f/u with sports medicine  Right knee pain With mod worsening recently with pain intermittent but also instability and near giveaways suggestive of ligamentous disorder, for plain films, consider sport med referrral if unrevealing  Followup: No follow-ups on file.  Oliver Barre, MD 08/29/2022 1:32 PM [facility]

## 2022-08-28 NOTE — Progress Notes (Unsigned)
    Danny Young D.[facility] [address] [place] [address] Phone: [phone]   Assessment and Plan:     There are no diagnoses linked to this encounter.  ***   Pertinent previous records reviewed include ***   Follow Up: ***     Subjective:   I, Danny Young, am serving as a Neurosurgeon for Doctor Richardean Sale  Chief Complaint: right knee, low back and left  hip pain  HPI:   08/29/2022 Patient is a 72 year old male complaining of right knee, low back, and left hip pain. Patient states  Relevant Historical Information: ***  Additional pertinent review of systems negative.   Current Outpatient Medications:    Ascorbic Acid (VITAMIN C) 500 MG CAPS, Take 2 tablets by mouth daily., Disp: , Rfl:    aspirin 81 MG chewable tablet, Chew 81 mg by mouth daily. , Disp: , Rfl:    benzonatate (TESSALON PERLES) 100 MG capsule, 1-2 tabs by mouth every 8 hrs as needed, Disp: 60 capsule, Rfl: 0   ciclopirox (PENLAC) 8 % solution, Apply topically at bedtime. Apply over nail and surrounding skin. Apply daily over previous coat. After seven (7) days, may remove with alcohol and continue cycle., Disp: 6.6 mL, Rfl: 2   guaiFENesin (MUCINEX) 600 MG 12 hr tablet, Take 2 tablets (1,200 mg total) by mouth 2 (two) times daily as needed., Disp: 60 tablet, Rfl: 1   ibuprofen (ADVIL) 200 MG tablet, Take 600 mg by mouth every 6 (six) hours as needed for fever or headache., Disp: , Rfl:    montelukast (SINGULAIR) 10 MG tablet, TAKE 1 TABLET BY MOUTH EVERYDAY AT BEDTIME, Disp: 90 tablet, Rfl: 3   Multiple Vitamin (MULTIVITAMIN) capsule, Take 1 capsule by mouth daily., Disp: , Rfl:    pantoprazole (PROTONIX) 40 MG tablet, Take 1 tablet (40 mg total) by mouth daily., Disp: 30 tablet, Rfl: 0   simvastatin (ZOCOR) 40 MG tablet, TAKE 1 TABLET BY MOUTH EVERYDAY AT BEDTIME, Disp: 90 tablet, Rfl: 3   triamcinolone (NASACORT) 55 MCG/ACT AERO nasal inhaler, Place 2 sprays  into the nose daily., Disp: 1 Inhaler, Rfl: 12   Objective:     There were no vitals filed for this visit.    There is no height or weight on file to calculate BMI.    Physical Exam:    ***   Electronically signed by:  Danny Young [facility] 7:31 AM 08/28/22

## 2022-08-29 ENCOUNTER — Ambulatory Visit: Payer: Medicare HMO | Admitting: Sports Medicine

## 2022-08-29 ENCOUNTER — Encounter: Payer: Self-pay | Admitting: Internal Medicine

## 2022-08-29 VITALS — BP 140/80 | HR 60 | Ht 73.0 in | Wt 175.0 lb

## 2022-08-29 DIAGNOSIS — M5442 Lumbago with sciatica, left side: Secondary | ICD-10-CM | POA: Diagnosis not present

## 2022-08-29 DIAGNOSIS — G8929 Other chronic pain: Secondary | ICD-10-CM

## 2022-08-29 DIAGNOSIS — M5136 Other intervertebral disc degeneration, lumbar region: Secondary | ICD-10-CM | POA: Diagnosis not present

## 2022-08-29 DIAGNOSIS — M25561 Pain in right knee: Secondary | ICD-10-CM

## 2022-08-29 MED ORDER — MELOXICAM 15 MG PO TABS: 15.0000 mg | ORAL_TABLET | Freq: Every day | ORAL | 0 refills | Status: DC

## 2022-08-29 NOTE — Assessment & Plan Note (Signed)

## 2022-08-29 NOTE — Assessment & Plan Note (Signed)
Lab Results  Component Value Date   LDLCALC 59 08/26/2022   Stable, pt to continue current statin zocor 40 mg qd

## 2022-08-29 NOTE — Assessment & Plan Note (Signed)
Lab Results  Component Value Date   HGBA1C 5.7 08/26/2022   Stable, pt to continue current medical treatment  - diet, wt control

## 2022-08-29 NOTE — Assessment & Plan Note (Signed)
With mod worsening recently with pain intermittent but also instability and near giveaways suggestive of ligamentous disorder, for plain films, consider sport med referrral if unrevealing

## 2022-08-29 NOTE — Patient Instructions (Addendum)
Good to see you Knee and hip HEP  - Start meloxicam 15 mg daily x2 weeks.  If still having pain after 2 weeks, complete 3rd-week of meloxicam. May use remaining meloxicam as needed once daily for pain control.  Do not to use additional NSAIDs while taking meloxicam.  May use Tylenol [phone] mg 2 to 3 times a day for breakthrough pain. 3-5 week follow up

## 2022-08-29 NOTE — Assessment & Plan Note (Signed)
Pt to continue low chol diet, excercise, low chol diet

## 2022-08-29 NOTE — Assessment & Plan Note (Signed)
No sciatica or neuro change, declines tramadol for now, for plain xrays today, consider f/u with sports medicine

## 2022-09-18 NOTE — Progress Notes (Deleted)
    Danny [facility] D.[facility] [address] [place] [address] Phone: [phone]   Assessment and Plan:     There are no diagnoses linked to this encounter.  ***   Pertinent previous records reviewed include ***   Follow Up: ***     Subjective:   I, Danny Young, am serving as a Neurosurgeon for Doctor Richardean Sale   Chief Complaint: right knee, low back and left  hip pain   HPI:    08/29/2022 Patient is a 72 year old male complaining of right knee, low back, and left hip pain. Patient states that he has had intermittent pain for a long time , he has pain when walks a good distance , he would get low back tightness when he would slow dance with his wife 15 years ago, he was told his hip pain is coming from his low back , no numbness or tingling, he had a pain run down his left leg the other day that he thinks he is coming from his back, tylenol and ibu help with his back pain as well    Right knee pain when he goes up steps, he gets a catching/ giving away feeling , no numbness or tingling, he states he feels something slip and then the pain goes away, he takes tylenol and that helps with the pain   09/19/2022 Patient states   Relevant Historical Information: WPW syndrome, GERD  Additional pertinent review of systems negative.   Current Outpatient Medications:    Ascorbic Acid (VITAMIN C) 500 MG CAPS, Take 2 tablets by mouth daily., Disp: , Rfl:    aspirin 81 MG chewable tablet, Chew 81 mg by mouth daily. , Disp: , Rfl:    ciclopirox (PENLAC) 8 % solution, Apply topically at bedtime. Apply over nail and surrounding skin. Apply daily over previous coat. After seven (7) days, may remove with alcohol and continue cycle., Disp: 6.6 mL, Rfl: 2   ibuprofen (ADVIL) 200 MG tablet, Take 600 mg by mouth every 6 (six) hours as needed for fever or headache., Disp: , Rfl:    meloxicam (MOBIC) 15 MG tablet, Take 1 tablet (15 mg total) by mouth daily.,  Disp: 30 tablet, Rfl: 0   montelukast (SINGULAIR) 10 MG tablet, TAKE 1 TABLET BY MOUTH EVERYDAY AT BEDTIME, Disp: 90 tablet, Rfl: 3   Multiple Vitamin (MULTIVITAMIN) capsule, Take 1 capsule by mouth daily., Disp: , Rfl:    pantoprazole (PROTONIX) 40 MG tablet, Take 1 tablet (40 mg total) by mouth daily., Disp: 30 tablet, Rfl: 0   simvastatin (ZOCOR) 40 MG tablet, TAKE 1 TABLET BY MOUTH EVERYDAY AT BEDTIME, Disp: 90 tablet, Rfl: 3   triamcinolone (NASACORT) 55 MCG/ACT AERO nasal inhaler, Place 2 sprays into the nose daily., Disp: 1 Inhaler, Rfl: 12   Objective:     There were no vitals filed for this visit.    There is no height or weight on file to calculate BMI.    Physical Exam:    ***   Electronically signed by:  Danny [facility] 7:17 AM 09/18/22

## 2022-09-19 ENCOUNTER — Ambulatory Visit: Payer: Medicare HMO | Admitting: Sports Medicine

## 2022-09-20 ENCOUNTER — Ambulatory Visit: Payer: Medicare HMO | Admitting: Sports Medicine

## 2022-09-20 VITALS — BP 116/78 | HR 61 | Ht 73.0 in | Wt 174.0 lb

## 2022-09-20 DIAGNOSIS — M25561 Pain in right knee: Secondary | ICD-10-CM

## 2022-09-20 DIAGNOSIS — M5136 Other intervertebral disc degeneration, lumbar region: Secondary | ICD-10-CM

## 2022-09-20 DIAGNOSIS — G8929 Other chronic pain: Secondary | ICD-10-CM

## 2022-09-20 DIAGNOSIS — M5442 Lumbago with sciatica, left side: Secondary | ICD-10-CM

## 2022-09-20 NOTE — Patient Instructions (Signed)
Tylenol [phone] mg 2-3 times a day for pain relief  Discontinue meloxicam Use remainder for break through pain as needed Continue HEP Follow up as needed

## 2022-09-20 NOTE — Progress Notes (Addendum)
Danny [facility] [address] [place] [address] Phone: [phone]   Assessment and Plan:     1. Chronic bilateral low back pain with left-sided sciatica 2. DDD (degenerative disc disease), lumbar 3. Chronic pain of right knee  -Chronic with exacerbation, subsequent visit - Overall significant improvement in low back and right knee pain after completing 2 to 3-week course of meloxicam and starting HEP.  Patient did have a recurrent flare of low back pain after working in his garden yesterday - Start Tylenol 500 to 1000 mg tablets 2-3 times a day for day-to-day pain relief - Discontinue meloxicam and use remainder of medication as needed for breakthrough pain - Continue HEP  Pertinent previous records reviewed include none   Follow Up: As needed if no improving or worsening symptoms.  Could consider advanced imaging versus CSI versus physical therapy   Subjective:   I, [name], am serving as a scribe for Dr. Richardean Sale  Chief Complaint: Back and knee pain  HPI:  He did some gardening yesterday which aggreavted his back. Had little pain days prior to that activity.    09/20/22 Knee is also doing better. Has not given out on him.   Relevant Historical Information:  WPW syndrome, GERD  Additional pertinent review of systems negative.   Current Outpatient Medications:    Ascorbic Acid (VITAMIN C) 500 MG CAPS, Take 2 tablets by mouth daily., Disp: , Rfl:    aspirin 81 MG chewable tablet, Chew 81 mg by mouth daily. , Disp: , Rfl:    ciclopirox (PENLAC) 8 % solution, Apply topically at bedtime. Apply over nail and surrounding skin. Apply daily over previous coat. After seven (7) days, may remove with alcohol and continue cycle., Disp: 6.6 mL, Rfl: 2   ibuprofen (ADVIL) 200 MG tablet, Take 600 mg by mouth every 6 (six) hours as needed for fever or headache., Disp: , Rfl:    meloxicam (MOBIC) 15 MG tablet, Take 1  tablet (15 mg total) by mouth daily., Disp: 30 tablet, Rfl: 0   montelukast (SINGULAIR) 10 MG tablet, TAKE 1 TABLET BY MOUTH EVERYDAY AT BEDTIME, Disp: 90 tablet, Rfl: 3   Multiple Vitamin (MULTIVITAMIN) capsule, Take 1 capsule by mouth daily., Disp: , Rfl:    pantoprazole (PROTONIX) 40 MG tablet, Take 1 tablet (40 mg total) by mouth daily., Disp: 30 tablet, Rfl: 0   simvastatin (ZOCOR) 40 MG tablet, TAKE 1 TABLET BY MOUTH EVERYDAY AT BEDTIME, Disp: 90 tablet, Rfl: 3   triamcinolone (NASACORT) 55 MCG/ACT AERO nasal inhaler, Place 2 sprays into the nose daily., Disp: 1 Inhaler, Rfl: 12   Objective:     Vitals:   09/20/22 0838  BP: 116/78  Pulse: 61  SpO2: 98%  Weight: 174 lb (78.9 kg)  Height: 6\' 1"  (1.854 m)      Body mass index is 22.96 kg/m.    Physical Exam:    Gen: Appears well, nad, nontoxic and pleasant Psych: Alert and oriented, appropriate mood and affect Neuro: sensation intact, strength is 5/5 in upper and lower extremities, muscle tone wnl Skin: no susupicious lesions or rashes  Back - Normal skin, Spine with normal alignment and no deformity.   No tenderness to vertebral process palpation.   Lower lumbar paraspinous muscles are mildly tender and without spasm NTTP gluteal musculature Straight leg raise negative Trendelenberg negative Piriformis Test negative   General:  awake, alert oriented, no acute distress nontoxic Skin: no  suspicious lesions or rashes Neuro:sensation intact and strength 5/5 with no deficits, no atrophy, normal muscle tone Psych: No signs of anxiety, depression or other mood disorder  Right knee: No swelling No deformity Neg fluid wave, joint milking ROM Flex 110, Ext 0 NTTP over the quad tendon, medial fem condyle, lat fem condyle, patella, plica, patella tendon, tibial tuberostiy, fibular head, posterior fossa, pes anserine bursa, gerdy's tubercle, medial jt line, lateral jt line    Gait normal   Electronically signed by:  Danny [facility] 8:50 AM 09/20/22

## 2022-09-25 ENCOUNTER — Other Ambulatory Visit: Payer: Self-pay | Admitting: Sports Medicine

## 2022-10-09 ENCOUNTER — Other Ambulatory Visit: Payer: Self-pay | Admitting: Internal Medicine

## 2022-10-13 ENCOUNTER — Other Ambulatory Visit: Payer: Self-pay | Admitting: Internal Medicine

## 2022-10-14 ENCOUNTER — Other Ambulatory Visit: Payer: Self-pay

## 2022-11-21 ENCOUNTER — Ambulatory Visit: Payer: Medicare HMO | Admitting: Internal Medicine

## 2022-11-21 VITALS — BP 126/80 | HR 61 | Temp 98.3°F | Ht 73.0 in | Wt 172.0 lb

## 2022-11-21 DIAGNOSIS — R058 Other specified cough: Secondary | ICD-10-CM

## 2022-11-21 DIAGNOSIS — E7800 Pure hypercholesterolemia, unspecified: Secondary | ICD-10-CM

## 2022-11-21 DIAGNOSIS — H68103 Unspecified obstruction of Eustachian tube, bilateral: Secondary | ICD-10-CM | POA: Diagnosis not present

## 2022-11-21 DIAGNOSIS — R739 Hyperglycemia, unspecified: Secondary | ICD-10-CM

## 2022-11-21 MED ORDER — DOXYCYCLINE HYCLATE 100 MG PO TABS
100.0000 mg | ORAL_TABLET | Freq: Two times a day (BID) | ORAL | 0 refills | Status: DC
Start: 2022-11-21 — End: 2023-04-29

## 2022-11-21 MED ORDER — HYDROCODONE BIT-HOMATROP MBR 5-1.5 MG/5ML PO SOLN: 5.0000 mL | Freq: Four times a day (QID) | ORAL | 0 refills | Status: AC | PRN

## 2022-11-21 NOTE — Progress Notes (Signed)
Patient ID: Danny Young, male   DOB: 26-Dec-1950, 72 y.o.   MRN: [id number]        Chief Complaint: follow up HTN, HLD and hyperglycemia , cough       HPI:  Danny Young is a 72 y.o. male Here with acute onset mild to mod 2-3 days ST, HA, general weakness and malaise, with prod cough greenish sputum, but Pt denies chest pain, increased sob or doe, wheezing, orthopnea, PND, increased LE swelling, palpitations, dizziness or syncope.   Pt denies polydipsia, polyuria, or new focal neuro s/s.    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 He has never used smokeless tobacco. He reports current alcohol use. He reports that he does not use drugs. family history includes Hypertension in his father and mother. No Known Allergies Current Outpatient Medications on File Prior to Visit  Medication Sig Dispense Refill   Ascorbic Acid (VITAMIN C) 500 MG CAPS Take 2 tablets by mouth daily.     aspirin 81 MG chewable  tablet Chew 81 mg by mouth daily.      ciclopirox (PENLAC) 8 % solution Apply topically at bedtime. Apply over nail and surrounding skin. Apply daily over previous coat. After seven (7) days, may remove with alcohol and continue cycle. 6.6 mL 2   ibuprofen (ADVIL) 200 MG tablet Take 600 mg by mouth every 6 (six) hours as needed for fever or headache.     meloxicam (MOBIC) 15 MG tablet Take 1 tablet (15 mg total) by mouth daily. 30 tablet 0   montelukast (SINGULAIR) 10 MG tablet TAKE 1 TABLET BY MOUTH EVERYDAY AT BEDTIME 90 tablet 2   Multiple Vitamin (MULTIVITAMIN) capsule Take 1 capsule by mouth daily.     pantoprazole (PROTONIX) 40 MG tablet Take 1 tablet (40 mg total) by mouth daily. 30 tablet 0   simvastatin (ZOCOR) 40 MG tablet TAKE 1 TABLET BY MOUTH EVERYDAY AT BEDTIME 90 tablet 3   triamcinolone (NASACORT) 55 MCG/ACT AERO nasal inhaler Place 2 sprays into the nose daily. 1 Inhaler 12   No current facility-administered medications on file prior to visit.        ROS:  All others reviewed and negative.  Objective  PE:  BP 126/80 (BP Location: Left Arm, Patient Position: Sitting, Cuff Size: Large)   Pulse 61   Temp 98.3 F (36.8 C) (Oral)   Ht 6\' 1"  (1.854 m)   Wt 172 lb (78 kg)   SpO2 98%   BMI 22.69 kg/m                 Constitutional: Pt appears in NAD               HENT: Head: NCAT.                Right Ear: External ear normal.                 Left Ear: External ear normal. Bilat tm's with mild erythema.  Max sinus areas non tender.  Pharynx with mild erythema, no exudate               Eyes: . Pupils are equal, round, and reactive to light. Conjunctivae and EOM are normal               Nose: without d/c or deformity               Neck: Neck supple. Gross normal ROM               Cardiovascular: Normal rate and regular rhythm.                 Pulmonary/Chest: Effort normal and breath sounds without rales or wheezing.                Abd:  Soft, NT, ND, + BS, no  organomegaly               Neurological: Pt is alert. At baseline orientation, motor grossly intact               Skin: Skin is warm. No rashes, no other new lesions, LE edema - none               Psychiatric: Pt behavior is normal without agitation   Micro: none  Cardiac tracings I have personally interpreted today:  none  Pertinent Radiological findings (summarize): none   Lab Results  Component Value Date   WBC 7.5 08/26/2022   HGB 16.5 08/26/2022   HCT 48.8 08/26/2022   PLT 182.0 08/26/2022   GLUCOSE 96 08/26/2022   CHOL 116 08/26/2022   TRIG 108.0 08/26/2022   HDL 35.00 (L) 08/26/2022   LDLDIRECT 84.1 01/06/2007   LDLCALC 59 08/26/2022   ALT 24 08/26/2022   AST 26 08/26/2022   NA 142 08/26/2022   K 4.7 08/26/2022   CL 104 08/26/2022   CREATININE 1.10 08/26/2022   BUN 25 (H) 08/26/2022   CO2 29 08/26/2022   TSH 1.28 08/26/2022   PSA 2.68 08/26/2022   INR 1.0 ratio 07/13/2009   HGBA1C 5.7 08/26/2022   Assessment/Plan:  Danny Young is a 72 y.o. White or Caucasian [1] male with  has a past medical history of ALLERGIC RHINITIS (01/19/2007), Allergy, Cataract, Cervical spine degeneration (10/22/2011), GERD (01/19/2007), HYPERLIPIDEMIA (01/19/2007), MIGRAINE HEADACHE (01/19/2007), Sleep apnea, and WOLFF (WOLFE)-PARKINSON-WHITE (WPW) SYNDROME (01/19/2007).  Blocked Eustachian tube, bilateral Also for mucinex bid prn  Hyperglycemia Lab Results  Component Value Date   HGBA1C 5.7 08/26/2022   Stable, pt to continue current medical treatment  - diet, wt control   Hyperlipidemia Lab Results  Component Value Date   LDLCALC 59 08/26/2022  Stable, pt to continue current statin zocor 40 qd   Productive cough Mild to mod,covdi neg at home,  can't r/o bronchitis vs pna, for cxr,  for antibx course doxycycline 100 bid, cough med prn,  to f/u any worsening symptoms or concerns  Followup: Return if symptoms worsen or fail to improve.  Oliver Barre, MD 11/23/2022 3:55  PM [facility]  Primary Care - [facility] Internal Medicine

## 2022-11-21 NOTE — Patient Instructions (Signed)
Please take all new medication as prescribed - the antibiotic, and cough medicine as needed  Please continue all other medications as before, and refills have been done if requested.  Please have the pharmacy call with any other refills you may need.  Please keep your appointments with your specialists as you may have planned  Please go to the XRAY Department in the first floor for the x-ray testing  Please go to the LAB at the blood drawing area for the tests to be done  You will be contacted by phone if any changes need to be made immediately.  Otherwise, you will receive a letter about your results with an explanation, but please check with MyChart first.

## 2022-11-23 ENCOUNTER — Encounter: Payer: Self-pay | Admitting: Internal Medicine

## 2022-11-23 NOTE — Assessment & Plan Note (Signed)
Lab Results  Component Value Date   LDLCALC 59 08/26/2022   Stable, pt to continue current statin zocor 40 qd

## 2022-11-23 NOTE — Assessment & Plan Note (Signed)
Also for mucinex bid prn

## 2022-11-23 NOTE — Assessment & Plan Note (Signed)
Lab Results  Component Value Date   HGBA1C 5.7 08/26/2022   Stable, pt to continue current medical treatment  - diet, wt control

## 2022-11-23 NOTE — Assessment & Plan Note (Addendum)
Mild to mod,covdi neg at home,  can't r/o bronchitis vs pna, for cxr,  for antibx course doxycycline 100 bid, cough med prn,  to f/u any worsening symptoms or concerns

## 2023-01-15 DIAGNOSIS — J309 Allergic rhinitis, unspecified: Secondary | ICD-10-CM | POA: Diagnosis not present

## 2023-01-15 DIAGNOSIS — G4733 Obstructive sleep apnea (adult) (pediatric): Secondary | ICD-10-CM | POA: Diagnosis not present

## 2023-01-15 DIAGNOSIS — Z973 Presence of spectacles and contact lenses: Secondary | ICD-10-CM | POA: Diagnosis not present

## 2023-01-15 DIAGNOSIS — Z8249 Family history of ischemic heart disease and other diseases of the circulatory system: Secondary | ICD-10-CM | POA: Diagnosis not present

## 2023-01-15 DIAGNOSIS — E785 Hyperlipidemia, unspecified: Secondary | ICD-10-CM | POA: Diagnosis not present

## 2023-01-15 DIAGNOSIS — H269 Unspecified cataract: Secondary | ICD-10-CM | POA: Diagnosis not present

## 2023-01-15 DIAGNOSIS — Z809 Family history of malignant neoplasm, unspecified: Secondary | ICD-10-CM | POA: Diagnosis not present

## 2023-01-29 DIAGNOSIS — M7742 Metatarsalgia, left foot: Secondary | ICD-10-CM | POA: Diagnosis not present

## 2023-01-29 DIAGNOSIS — M7541 Impingement syndrome of right shoulder: Secondary | ICD-10-CM | POA: Diagnosis not present

## 2023-01-29 DIAGNOSIS — M25512 Pain in left shoulder: Secondary | ICD-10-CM | POA: Diagnosis not present

## 2023-02-01 DIAGNOSIS — M7742 Metatarsalgia, left foot: Secondary | ICD-10-CM | POA: Insufficient documentation

## 2023-03-31 ENCOUNTER — Encounter: Payer: Self-pay | Admitting: Family Medicine

## 2023-03-31 ENCOUNTER — Ambulatory Visit (INDEPENDENT_AMBULATORY_CARE_PROVIDER_SITE_OTHER): Payer: Medicare HMO | Admitting: Family Medicine

## 2023-03-31 VITALS — BP 134/72 | HR 67 | Temp 97.7°F | Ht 73.0 in | Wt 176.1 lb

## 2023-03-31 DIAGNOSIS — J069 Acute upper respiratory infection, unspecified: Secondary | ICD-10-CM | POA: Diagnosis not present

## 2023-03-31 DIAGNOSIS — R051 Acute cough: Secondary | ICD-10-CM

## 2023-03-31 MED ORDER — AZITHROMYCIN 250 MG PO TABS
ORAL_TABLET | ORAL | 0 refills | Status: DC
Start: 2023-03-31 — End: 2023-04-29

## 2023-03-31 NOTE — Progress Notes (Signed)
Acute Office Visit  Subjective:     Patient ID: Danny Young, male    DOB: [birth year], 72 y.o.   MRN: [id number]  Chief Complaint  Patient presents with   URI    About a week and half, chest congestion (coughing up mucus). Been taking OTC meds mucinex but now on nyquil and dayquil    URI    Patient is in today for cough, congestion for the last 10 days, worse in the mornings. Reports that he is coughing up thick green and yellow sputum. Has taken OTC Mucinex, DayQuil/NyQuil with little relief. States that he is also used benzonatate at home for renal prescription, states this was not helpful. Also had some hydrocodone cough syrup that he did take that was very helpful.  Declines the need for refills today. Denies abdominal pain, nausea, vomiting, diarrhea, rash, fever, headaches, other symptoms today. Denies known sick contacts. Medical history as outlined below.    ROS Per HPI      Objective:    BP 134/72   Pulse 67   Temp 97.7 F (36.5 C) (Temporal)   Ht 6\' 1"  (1.854 m)   Wt 176 lb 2 oz (79.9 kg)   SpO2 96%   BMI 23.24 kg/m    Physical Exam Vitals and nursing note reviewed.  Constitutional:      Appearance: Normal appearance. He is normal weight.  HENT:     Head: Normocephalic and atraumatic.     Right Ear: Tympanic membrane and ear canal normal.     Left Ear: Tympanic membrane and ear canal normal.     Nose: Congestion and rhinorrhea present.     Right Sinus: No maxillary sinus tenderness or frontal sinus tenderness.     Left Sinus: No maxillary sinus tenderness or frontal sinus tenderness.     Mouth/Throat:     Mouth: Mucous membranes are moist.     Pharynx: Oropharynx is clear. No oropharyngeal exudate or posterior oropharyngeal erythema.     Comments: Cobblestoning present Cardiovascular:     Rate and Rhythm: Normal rate and regular rhythm.     Heart sounds: Normal heart sounds.  Pulmonary:     Effort: No respiratory distress.     Breath  sounds: No stridor. No wheezing or rhonchi.  Musculoskeletal:     Cervical back: Normal range of motion.  Lymphadenopathy:     Cervical: No cervical adenopathy.  Skin:    General: Skin is warm and dry.  Neurological:     Mental Status: He is alert.     No results found for any visits on 03/31/23.      Assessment & Plan:  1. Upper respiratory tract infection, unspecified type  - azithromycin (ZITHROMAX Z-PAK) 250 MG tablet; Take 2 tablets (500 mg) PO today, then 1 tablet (250 mg) PO daily x4 days.  Dispense: 6 tablet; Refill: 0  2. Acute cough  - May use home hydrocodone cough syrup, will not refill as he states he has plenty - Discussed that this medication can make you sleepy.  Do not drive or operate heavy machinery while taking this medication.      Meds ordered this encounter  Medications   azithromycin (ZITHROMAX Z-PAK) 250 MG tablet    Sig: Take 2 tablets (500 mg) PO today, then 1 tablet (250 mg) PO daily x4 days.    Dispense:  6 tablet    Refill:  0    Return if symptoms worsen or fail to improve.  Moshe Cipro, FNP

## 2023-04-25 ENCOUNTER — Ambulatory Visit: Payer: Self-pay | Admitting: Internal Medicine

## 2023-04-25 NOTE — Telephone Encounter (Signed)
Copied from CRM [id number]. Topic: Clinical - Red Word Triage >> Apr 25, 2023 10:53 AM Dennison Nancy wrote: Red Word that prompted transfer to Nurse Triage: patient stated he has a little shortness of breath and it was no big deal and did not want to speak with a nurse , yet he trying to find a sooner appt.  Chief Complaint: wanting earlier apt. To see pcp d/t cold like symptoms and sob Symptoms: denies fever; states just had a question Frequency: na Pertinent Negatives: Patient denies fever Disposition: [] ED /[] Urgent Care (no appt availability in office) / [] Appointment(In office/virtual)/ []  [facility]/ [x] Home Care/ [] Refused Recommended Disposition /[] [facility]/ []  Follow-up with PCP Additional Notes: patient called in to see if he could get an earlier apt. States he has cold like symptoms and no fever.  Did not want triaged.  Message sent to office and instructed patient to go to ER if becomes worse.   Reason for Disposition  Nursing judgment or information in reference  Answer Assessment - Initial Assessment Questions 1. REASON FOR CALL: "What is your main concern right now?"     Wanted to see if an earlier apt. Was available; states has sob but nothing urgent, just a cold.  Protocols used: No Guideline Available-A-AH

## 2023-04-25 NOTE — Telephone Encounter (Signed)
Called and scheduled PT for a appt on 04/29/23.

## 2023-04-29 ENCOUNTER — Ambulatory Visit (INDEPENDENT_AMBULATORY_CARE_PROVIDER_SITE_OTHER): Payer: Medicare HMO | Admitting: Internal Medicine

## 2023-04-29 ENCOUNTER — Encounter: Payer: Self-pay | Admitting: Internal Medicine

## 2023-04-29 VITALS — BP 124/70 | HR 53 | Temp 99.0°F | Ht 73.0 in | Wt 178.0 lb

## 2023-04-29 DIAGNOSIS — J45901 Unspecified asthma with (acute) exacerbation: Secondary | ICD-10-CM | POA: Insufficient documentation

## 2023-04-29 DIAGNOSIS — J4531 Mild persistent asthma with (acute) exacerbation: Secondary | ICD-10-CM | POA: Diagnosis not present

## 2023-04-29 DIAGNOSIS — E538 Deficiency of other specified B group vitamins: Secondary | ICD-10-CM | POA: Diagnosis not present

## 2023-04-29 DIAGNOSIS — R739 Hyperglycemia, unspecified: Secondary | ICD-10-CM

## 2023-04-29 DIAGNOSIS — E559 Vitamin D deficiency, unspecified: Secondary | ICD-10-CM

## 2023-04-29 DIAGNOSIS — Z125 Encounter for screening for malignant neoplasm of prostate: Secondary | ICD-10-CM

## 2023-04-29 DIAGNOSIS — R058 Other specified cough: Secondary | ICD-10-CM

## 2023-04-29 DIAGNOSIS — E7800 Pure hypercholesterolemia, unspecified: Secondary | ICD-10-CM | POA: Diagnosis not present

## 2023-04-29 MED ORDER — METHYLPREDNISOLONE 4 MG PO TBPK
ORAL_TABLET | ORAL | 0 refills | Status: DC
Start: 2023-04-29 — End: 2023-07-22

## 2023-04-29 MED ORDER — ALBUTEROL SULFATE HFA 108 (90 BASE) MCG/ACT IN AERS: 2.0000 | INHALATION_SPRAY | Freq: Four times a day (QID) | RESPIRATORY_TRACT | 0 refills | Status: DC | PRN

## 2023-04-29 MED ORDER — LEVOFLOXACIN 500 MG PO TABS
500.0000 mg | ORAL_TABLET | Freq: Every day | ORAL | 0 refills | Status: DC
Start: 2023-04-29 — End: 2023-07-22

## 2023-04-29 NOTE — Assessment & Plan Note (Signed)
Mild to mod, for antibx course levaquin 500 every day, cough med prn,  to f/u any worsening symptoms or concerns

## 2023-04-29 NOTE — Patient Instructions (Signed)
 Please take all new medication as prescribed - the antibiotic, medrol  steroid pack, and albuterol  inhaler as needed  Please let us  know if not improved for chest xray  Please continue all other medications as before, and refills have been done if requested.  Please have the pharmacy call with any other refills you may need.  Please keep your appointments with your specialists as you may have planned  Please make an Appointment to return in 6 months, or sooner if needed, also with Lab Appointment for testing done 3-5 days before at the FIRST FLOOR Lab (so this is for TWO appointments - please see the scheduling desk as you leave)

## 2023-04-29 NOTE — Progress Notes (Signed)
 Patient ID: Danny Young, male   DOB: December 29, 1950, 72 y.o.   MRN: [id number]        Chief Complaint: follow up prod cough, wheezing, hyperglycemia, hld       HPI:  Danny Young is a 72 y.o. male Here with acute onset mild to mod 4 days ST, HA, general weakness and malaise, with prod cough greenish sputum, but Pt denies chest pain, increased sob or doe, wheezing, orthopnea, PND, increased LE swelling, palpitations, dizziness or syncope, but has mild wheezing sob since yesterday          Wt Readings from Last 3 Encounters:  04/29/23 178 lb (80.7 kg)  03/31/23 176 lb 2 oz (79.9 kg)  11/21/22 172 lb (78 kg)   BP Readings from Last 3 Encounters:  04/29/23 124/70  03/31/23 134/72  11/21/22 126/80         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 He has never used smokeless tobacco. He reports current alcohol use. He reports that he does not use drugs. family history includes Hypertension in his father and mother. No Known Allergies Current Outpatient Medications on File Prior to Visit  Medication Sig Dispense Refill   aspirin  81 MG chewable tablet Chew 81 mg by mouth daily.      ibuprofen (ADVIL) 200 MG tablet Take 600 mg by mouth every 6 (six) hours as needed for fever or headache.     montelukast  (SINGULAIR ) 10 MG tablet TAKE 1 TABLET BY MOUTH EVERYDAY  AT BEDTIME 90 tablet 2   Multiple Vitamin (MULTIVITAMIN) capsule Take 1 capsule by mouth daily.     pantoprazole  (PROTONIX ) 40 MG tablet Take 1 tablet (40 mg total) by mouth daily. 30 tablet 0   simvastatin  (ZOCOR ) 40 MG tablet TAKE 1 TABLET BY MOUTH EVERYDAY AT BEDTIME 90 tablet 3   triamcinolone  (NASACORT ) 55 MCG/ACT AERO nasal inhaler Place 2 sprays into the nose daily. 1 Inhaler 12   No current facility-administered medications on file prior to visit.        ROS:  All others reviewed and negative.  Objective        PE:  BP 124/70 (BP Location: Right Arm, Patient Position: Sitting, Cuff Size: Normal)   Pulse (!) 53   Temp 99 F (37.2 C) (Oral)   Ht 6' 1 (1.854 m)   Wt 178 lb (80.7 kg)   SpO2 98%   BMI 23.48 kg/m                 Constitutional: Pt appears mild ill               HENT: Head: NCAT.  Right Ear: External ear normal.                 Left Ear: External ear normal. Bilat tm's with mild erythema.  Max sinus areas non tender.  Pharynx with mild erythema, no exudate               Eyes: . Pupils are equal, round, and reactive to light. Conjunctivae and EOM are normal               Nose: without d/c or deformity               Neck: Neck supple. Gross normal ROM               Cardiovascular: Normal rate and regular rhythm.                 Pulmonary/Chest: Effort normal and breath sounds without rales but with few mild wheezing.                Abd:  Soft, NT, ND, + BS, no organomegaly               Neurological: Pt is alert. At baseline orientation, motor grossly intact               Skin: Skin is warm. No rashes, no other new lesions, LE edema - none               Psychiatric: Pt behavior is normal without agitation   Micro: none  Cardiac tracings I have personally interpreted today:  none  Pertinent Radiological findings (summarize): none   Lab Results  Component Value Date   WBC 7.5 08/26/2022   HGB 16.5 08/26/2022   HCT 48.8 08/26/2022   PLT 182.0  08/26/2022   GLUCOSE 96 08/26/2022   CHOL 116 08/26/2022   TRIG 108.0 08/26/2022   HDL 35.00 (L) 08/26/2022   LDLDIRECT 84.1 01/06/2007   LDLCALC 59 08/26/2022   ALT 24 08/26/2022   AST 26 08/26/2022   NA 142 08/26/2022   K 4.7 08/26/2022   CL 104 08/26/2022   CREATININE 1.10 08/26/2022   BUN 25 (H) 08/26/2022   CO2 29 08/26/2022   TSH 1.28 08/26/2022   PSA 2.68 08/26/2022   INR 1.0 ratio 07/13/2009   HGBA1C 5.7 08/26/2022   Assessment/Plan:  Danny Young is a 72 y.o. White or Caucasian [1] male with  has a past medical history of ALLERGIC RHINITIS (01/19/2007), Allergy, Cataract, Cervical spine degeneration (10/22/2011), GERD (01/19/2007), HYPERLIPIDEMIA (01/19/2007), MIGRAINE HEADACHE (01/19/2007), Sleep apnea, and WOLFF (WOLFE)-PARKINSON-WHITE (WPW) SYNDROME (01/19/2007).  Hyperlipidemia Lab Results  Component Value Date   LDLCALC 59 08/26/2022   Stable, pt to continue current statin zocor  40 mg qd   Hyperglycemia Lab Results  Component Value Date   HGBA1C 5.7 08/26/2022   Stable, pt to continue current medical treatment  - diet,wt control   Productive cough Mild to mod, for antibx course levaquin  500 every day, cough med prn,  to f/u any worsening symptoms or concerns   Asthma exacerbation Mild to mod, for medrol  pak, declines inhaler fo rnow,  to f/u any worsening symptoms or concerns  Followup: Return in about 6 months (around 10/27/2023).  Lynwood Rush, MD 04/29/2023 10:01 AM [facility] [place] [facility] - [facility] Internal Medicine

## 2023-04-29 NOTE — Assessment & Plan Note (Signed)
 Mild to mod, for medrol pak, declines inhaler fo rnow,  to f/u any worsening symptoms or concerns

## 2023-04-29 NOTE — Assessment & Plan Note (Signed)
Lab Results  Component Value Date   LDLCALC 59 08/26/2022   Stable, pt to continue current statin zocor 40 mg qd

## 2023-04-29 NOTE — Assessment & Plan Note (Signed)
Lab Results  Component Value Date   HGBA1C 5.7 08/26/2022   Stable, pt to continue current medical treatment  - diet, wt control

## 2023-05-01 ENCOUNTER — Ambulatory Visit: Payer: Medicare HMO | Admitting: Internal Medicine

## 2023-05-11 ENCOUNTER — Ambulatory Visit
Admission: RE | Admit: 2023-05-11 | Discharge: 2023-05-11 | Disposition: A | Discharge status: Home / Self Care | Payer: Medicare Other | Source: Ambulatory Visit | Attending: Emergency Medicine | Admitting: Emergency Medicine

## 2023-05-11 ENCOUNTER — Ambulatory Visit (INDEPENDENT_AMBULATORY_CARE_PROVIDER_SITE_OTHER): Payer: Medicare Other

## 2023-05-11 VITALS — BP 129/67 | HR 66 | Temp 98.0°F | Resp 17

## 2023-05-11 DIAGNOSIS — R058 Other specified cough: Secondary | ICD-10-CM

## 2023-05-11 DIAGNOSIS — R0981 Nasal congestion: Secondary | ICD-10-CM

## 2023-05-11 NOTE — Discharge Instructions (Signed)
 Follow up with your primary care provider if your symptoms are not improving.

## 2023-05-11 NOTE — ED Provider Notes (Signed)
 CAY RALPH PELT    CSN: [id number] Arrival date & time: 05/11/23  1317      History   Chief Complaint Chief Complaint  Patient presents with   Nasal Congestion    Have had a really bad bug with productive cough, sneezing, bad sinus pain, shortness of breath. PCP has given me two rounds of antibiotics, a steroid and an inhaler. Now have green/yellow mucus  from nose along with a nose bleed when blowing nose. - Entered by patient    HPI Danny Young is a 73 y.o. male.  Patient presents with 6-week history of congestion, postnasal drip, cough.  He reports shortness of breath with coughing episodes.  He states his nasal drainage is yellow and he had a nosebleed yesterday.  No fever or chest pain.    Patient was seen by his PCP on 03/31/2023; diagnosed with upper respiratory tract infection and acute cough; treated with Zithromax .  He was seen again by his PCP on 04/29/2023; diagnosed with productive cough, asthma exacerbation; treated with albuterol  inhaler, Levaquin , Medrol  Dosepak.  The history is provided by the patient and medical records.    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 , Dr Klein/card     Patient Active Problem List   Diagnosis Date Noted   Asthma exacerbation 04/29/2023   Metatarsalgia of left foot 02/01/2023   Productive cough 11/21/2022   Right knee pain 08/26/2022   Low back pain 08/26/2022   Onychomycosis 03/31/2022   Left otitis media with effusion 02/01/2022    Blocked Eustachian tube, bilateral 02/01/2022   Bilateral thumb pain 08/23/2021   Umbilical hernia 08/21/2020   Medial epicondylitis 08/21/2020   Aortic atherosclerosis (HCC) 08/21/2020   Sinusitis 07/23/2020   Impingement syndrome of right shoulder region 09/07/2019   Right shoulder pain 08/24/2019   Cough 02/10/2018   Wheezing 02/10/2018   Actinic keratosis 02/12/2017   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   OSA (obstructive sleep apnea) 02/06/2017   Facial skin lesion 12/26/2016   Hyperglycemia 12/26/2016   Tremor 06/07/2015   Pyrexia 06/07/2015   Midline low back pain without sciatica 06/07/2015   Plantar fasciitis of right foot 01/06/2013   Encounter for well adult exam with abnormal findings 10/22/2011   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 Authorizing Provider  albuterol  (VENTOLIN  HFA) 108 (90 Base) MCG/ACT inhaler Inhale 2 puffs into the lungs every 6 (six) hours as needed for wheezing or shortness of breath.  04/29/23   Norleen Lynwood ORN, MD  aspirin  81 MG chewable tablet Chew 81 mg by mouth daily.     [provider]  ibuprofen (ADVIL) 200 MG tablet Take 600 mg by mouth every 6 (six) hours as needed for fever or headache.    [provider]  levofloxacin  (LEVAQUIN ) 500 MG tablet Take 1 tablet (500 mg total) by mouth daily. Patient not taking: Reported on 05/11/2023 04/29/23   Norleen Lynwood ORN, MD  methylPREDNISolone  (MEDROL  DOSEPAK) 4 MG TBPK tablet 4 tab by mouth x 3 days, 2 tab x 3 days, 1 tab x 3 days Patient not taking: Reported on 05/11/2023  04/29/23   Norleen Lynwood ORN, MD  montelukast  (SINGULAIR ) 10 MG tablet TAKE 1 TABLET BY MOUTH EVERYDAY AT BEDTIME 10/09/22   Norleen Lynwood ORN, MD  Multiple Vitamin (MULTIVITAMIN) capsule Take 1 capsule by mouth daily.    [provider]  pantoprazole  (PROTONIX ) 40 MG tablet Take 1 tablet (40 mg total) by mouth daily. 04/01/22   Rollene Almarie LABOR, MD  simvastatin  (ZOCOR ) 40 MG tablet TAKE 1 TABLET BY MOUTH EVERYDAY AT BEDTIME 10/14/22   Norleen Lynwood ORN, MD  triamcinolone  (NASACORT ) 55 MCG/ACT AERO nasal inhaler Place 2 sprays into the nose daily. Patient not taking: Reported on 05/11/2023 07/14/18   Norleen Lynwood ORN, MD    Family History Family History  Problem Relation Age of Onset   Hypertension Mother    Hypertension Father     Social History Social History   Tobacco Use   Smoking status: Never   Smokeless tobacco: Never  Vaping Use   Vaping status: Never Used  Substance Use Topics   Alcohol use: Yes    Comment: very occasional wine   Drug use: No     Allergies   Patient has no known allergies.   Review of Systems Review of Systems  Constitutional:  Negative for chills and fever.  HENT:  Positive for congestion and postnasal drip. Negative for ear pain and sore throat.   Respiratory:  Positive for cough and shortness of breath.   Cardiovascular:  Negative for chest pain and palpitations.     Physical Exam Triage Vital Signs ED Triage Vitals  Encounter Vitals Group     BP      Systolic BP Percentile      Diastolic BP Percentile      Pulse      Resp      Temp      Temp src      SpO2      Weight      Height      Head Circumference      Peak Flow      Pain Score      Pain Loc      Pain Education      Exclude from Growth Chart    No data found.  Updated Vital Signs BP 129/67   Pulse 66   Temp 98 F (36.7 C)   Resp 17   SpO2 98%   Visual Acuity Right Eye Distance:   Left Eye Distance:   Bilateral Distance:    Right Eye Near:   Left Eye Near:     Bilateral Near:     Physical Exam Constitutional:      General: He is not in acute distress. HENT:     Right Ear: Tympanic membrane normal.     Left Ear: Tympanic membrane normal.     Nose: Nose  normal.     Mouth/Throat:     Mouth: Mucous membranes are moist.     Pharynx: Oropharynx is clear.  Cardiovascular:     Rate and Rhythm: Normal rate and regular rhythm.     Heart sounds: Normal heart sounds.  Pulmonary:     Effort: Pulmonary effort is normal. No respiratory distress.     Breath sounds: Normal breath sounds.  Neurological:     Mental Status: He is alert.      UC Treatments / Results  Labs (all labs ordered are listed, but only abnormal results are displayed) Labs Reviewed - No data to display  EKG   Radiology DG Chest 2 View Result Date: 05/11/2023 CLINICAL DATA:  Productive cough EXAM: CHEST - 2 VIEW COMPARISON:  04/01/2022 FINDINGS: The heart size and mediastinal contours are within normal limits. Both lungs are clear. The visualized skeletal structures are unremarkable. IMPRESSION: No active cardiopulmonary disease. Electronically Signed   By: Norleen DELENA Kil M.D.   On: 05/11/2023 13:56    Procedures Procedures (including critical care time)  Medications Ordered in UC Medications - No data to display  Initial Impression / Assessment and Plan / UC Course  I have reviewed the triage vital signs and the nursing notes.  Pertinent labs & imaging results that were available during my care of the patient were reviewed by me and considered in my medical decision making (see chart for details).    Productive cough, nasal congestion.  Lungs are clear and O2 sat 98% on room air.  Chest x-ray negative.  Patient has been treated with Zithromax , Levaquin , Medrol  Dosepak, and an albuterol  inhaler since the onset of his symptoms in early December.  Discussed symptomatic treatment for his cough and congestion.  Instructed him to follow-up with his PCP if he is not improving.   He declines prescription cough medication.  He agrees to plan of care.  Final Clinical Impressions(s) / UC Diagnoses   Final diagnoses:  Productive cough  Nasal congestion     Discharge Instructions      Follow-up with your primary care provider if your symptoms are not improving.      ED Prescriptions   None    PDMP not reviewed this encounter.   Corlis Burnard DEL, NP 05/11/23 1408

## 2023-05-11 NOTE — ED Triage Notes (Signed)
 Patient to Urgent Care with complaints of nasal and chest congestion and cough/ sneezing/ sinus pain/ shortness of breath.  Symptoms started one month ago. Has completed two courses of antibiotics and a steroid. Worsened Thursday w/ yellow sputum.

## 2023-05-13 DIAGNOSIS — L821 Other seborrheic keratosis: Secondary | ICD-10-CM | POA: Diagnosis not present

## 2023-05-13 DIAGNOSIS — Z85828 Personal history of other malignant neoplasm of skin: Secondary | ICD-10-CM | POA: Diagnosis not present

## 2023-05-13 DIAGNOSIS — L570 Actinic keratosis: Secondary | ICD-10-CM | POA: Diagnosis not present

## 2023-05-13 DIAGNOSIS — L820 Inflamed seborrheic keratosis: Secondary | ICD-10-CM | POA: Diagnosis not present

## 2023-05-13 DIAGNOSIS — L218 Other seborrheic dermatitis: Secondary | ICD-10-CM | POA: Diagnosis not present

## 2023-05-13 DIAGNOSIS — L814 Other melanin hyperpigmentation: Secondary | ICD-10-CM | POA: Diagnosis not present

## 2023-05-21 ENCOUNTER — Other Ambulatory Visit: Payer: Self-pay | Admitting: Internal Medicine

## 2023-05-21 ENCOUNTER — Other Ambulatory Visit: Payer: Self-pay

## 2023-06-10 ENCOUNTER — Telehealth: Payer: Self-pay | Admitting: Internal Medicine

## 2023-06-10 MED ORDER — NIRMATRELVIR/RITONAVIR (PAXLOVID)TABLET: 3.0000 | ORAL_TABLET | Freq: Two times a day (BID) | ORAL | 0 refills | Status: AC

## 2023-06-10 NOTE — Telephone Encounter (Signed)
Ok done erx

## 2023-06-10 NOTE — Telephone Encounter (Unsigned)
Copied from CRM [id number]. Topic: General - Other >> Jun 10, 2023 11:13 AM Sim Boast F wrote: Reason for CRM: Patient requesting medication sent to pharmacy for covid - says he tested positive and has a runny nose

## 2023-06-18 DIAGNOSIS — M25512 Pain in left shoulder: Secondary | ICD-10-CM | POA: Diagnosis not present

## 2023-07-11 DIAGNOSIS — H5213 Myopia, bilateral: Secondary | ICD-10-CM | POA: Diagnosis not present

## 2023-07-14 DIAGNOSIS — K080 Exfoliation of teeth due to systemic causes: Secondary | ICD-10-CM | POA: Diagnosis not present

## 2023-07-22 ENCOUNTER — Encounter: Payer: Self-pay | Admitting: Internal Medicine

## 2023-07-22 ENCOUNTER — Ambulatory Visit (INDEPENDENT_AMBULATORY_CARE_PROVIDER_SITE_OTHER): Admitting: Internal Medicine

## 2023-07-22 VITALS — BP 132/70 | HR 70 | Temp 98.2°F | Ht 73.0 in | Wt 171.0 lb

## 2023-07-22 DIAGNOSIS — K219 Gastro-esophageal reflux disease without esophagitis: Secondary | ICD-10-CM | POA: Diagnosis not present

## 2023-07-22 DIAGNOSIS — R739 Hyperglycemia, unspecified: Secondary | ICD-10-CM | POA: Diagnosis not present

## 2023-07-22 DIAGNOSIS — J329 Chronic sinusitis, unspecified: Secondary | ICD-10-CM | POA: Diagnosis not present

## 2023-07-22 DIAGNOSIS — J309 Allergic rhinitis, unspecified: Secondary | ICD-10-CM | POA: Diagnosis not present

## 2023-07-22 MED ORDER — LEVOFLOXACIN 500 MG PO TABS
500.0000 mg | ORAL_TABLET | Freq: Every day | ORAL | 0 refills | Status: DC
Start: 2023-07-22 — End: 2023-09-15

## 2023-07-22 NOTE — Assessment & Plan Note (Signed)
 Stable overall, continue singulair 10 qd

## 2023-07-22 NOTE — Progress Notes (Signed)
 Patient ID: Danny Young, male   DOB: December 18, 1950, 73 y.o.   MRN: [id number]        Chief Complaint: follow up recurrent sinusitis, hyperglycemia, allergies        HPI:  Danny Young is a 73 y.o. male  Here with 2-3 days acute onset fever, facial pain, pressure, headache, general weakness and malaise, and greenish d/c, with mild ST and cough, but pt denies chest pain, wheezing, increased sob or doe, orthopnea, PND, increased LE swelling, palpitations, dizziness or syncope.  Third episode in 3 months.   Pt denies polydipsia, polyuria, or new focal neuro s/s.    Pt denies recent wt loss, night sweats, loss of appetite, or other constitutional symptoms   Denies worsening reflux, abd pain, dysphagia, n/v, bowel change or blood.       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 He has never used smokeless tobacco. He reports current alcohol use. He reports that he does not use drugs. family history includes Hypertension in his father and mother. No Known Allergies Current Outpatient Medications on File Prior to Visit  Medication Sig Dispense  Refill   aspirin 81 MG chewable tablet Chew 81 mg by mouth daily.      ibuprofen (ADVIL) 200 MG tablet Take 600 mg by mouth every 6 (six) hours as needed for fever or headache.     montelukast (SINGULAIR) 10 MG tablet TAKE 1 TABLET BY MOUTH EVERYDAY AT BEDTIME 90 tablet 2   Multiple Vitamin (MULTIVITAMIN) capsule Take 1 capsule by mouth daily.     simvastatin (ZOCOR) 40 MG tablet TAKE 1 TABLET BY MOUTH EVERYDAY AT BEDTIME 90 tablet 3   No current facility-administered medications on file prior to visit.        ROS:  All others reviewed and negative.  Objective        PE:  BP 132/70 (BP Location: Left Arm, Patient Position: Sitting, Cuff Size: Normal)   Pulse 70   Temp 98.2 F (36.8 C) (Oral)   Ht 6\' 1"  (1.854 m)   Wt 171 lb (77.6 kg)   SpO2 96%   BMI 22.56 kg/m                 Constitutional: Pt appears mild ill               HENT: Head: NCAT.  Right Ear: External ear normal.                 Left Ear: External ear normal. Bilat tm's with mild erythema.  Max sinus areas mild tender.  Pharynx with mild erythema, no exudate               Eyes: . Pupils are equal, round, and reactive to light. Conjunctivae and EOM are normal               Nose: without d/c or deformity               Neck: Neck supple. Gross normal ROM               Cardiovascular: Normal rate and regular rhythm.                 Pulmonary/Chest: Effort normal and breath sounds without rales or wheezing.                               Neurological: Pt is alert. At baseline orientation, motor grossly intact               Skin: Skin is warm. No rashes, no other new lesions, LE edema - none               Psychiatric: Pt behavior is normal without agitation   Micro: none  Cardiac tracings I have personally interpreted today:  none  Pertinent Radiological findings (summarize): none   Lab Results  Component Value Date   WBC 7.5 08/26/2022   HGB 16.5 08/26/2022   HCT 48.8 08/26/2022   PLT 182.0  08/26/2022   GLUCOSE 96 08/26/2022   CHOL 116 08/26/2022   TRIG 108.0 08/26/2022   HDL 35.00 (L) 08/26/2022   LDLDIRECT 84.1 01/06/2007   LDLCALC 59 08/26/2022   ALT 24 08/26/2022   AST 26 08/26/2022   NA 142 08/26/2022   K 4.7 08/26/2022   CL 104 08/26/2022   CREATININE 1.10 08/26/2022   BUN 25 (H) 08/26/2022   CO2 29 08/26/2022   TSH 1.28 08/26/2022   PSA 2.68 08/26/2022   INR 1.0 ratio 07/13/2009   HGBA1C 5.7 08/26/2022   Assessment/Plan:  Danny Young is a 73 y.o. White or Caucasian [1] male with  has a past medical history of ALLERGIC RHINITIS (01/19/2007), Allergy, Cataract, Cervical spine degeneration (10/22/2011), GERD (01/19/2007), HYPERLIPIDEMIA (01/19/2007), MIGRAINE HEADACHE (01/19/2007), Sleep apnea, and WOLFF (WOLFE)-PARKINSON-WHITE (WPW) SYNDROME (01/19/2007).  Recurrent sinusitis Mild to mod, for antibx course levaquin 500 every day, for CT sinus,  to f/u any worsening symptoms or concerns  Hyperglycemia Lab Results  Component Value Date   HGBA1C 5.7 08/26/2022   Stable, pt to continue current medical treatment  - diet, wt control   Allergic rhinitis Stable overall, continue singulair 10 qd  GERD Stable overall, for otc pepcid prn  Followup: Return if symptoms worsen or fail to improve.  Oliver Barre, MD 07/22/2023 8:35 PM [facility]

## 2023-07-22 NOTE — Patient Instructions (Signed)
 Please take all new medication as prescribed - the antibiotic  Please continue all other medications as before, and refills have been done if requested.  Please have the pharmacy call with any other refills you may need.  Please keep your appointments with your specialists as you may have planned  You will be contacted regarding the referral for: CT Sinus

## 2023-07-22 NOTE — Assessment & Plan Note (Signed)
 Stable overall, for otc pepcid prn

## 2023-07-22 NOTE — Assessment & Plan Note (Signed)
 Mild to mod, for antibx course levaquin 500 every day, for CT sinus,  to f/u any worsening symptoms or concerns

## 2023-07-22 NOTE — Assessment & Plan Note (Signed)
Lab Results  Component Value Date   HGBA1C 5.7 08/26/2022   Stable, pt to continue current medical treatment  - diet, wt control

## 2023-07-25 DIAGNOSIS — M7062 Trochanteric bursitis, left hip: Secondary | ICD-10-CM | POA: Insufficient documentation

## 2023-08-13 DIAGNOSIS — K080 Exfoliation of teeth due to systemic causes: Secondary | ICD-10-CM | POA: Diagnosis not present

## 2023-08-14 ENCOUNTER — Encounter: Payer: Self-pay | Admitting: Internal Medicine

## 2023-08-14 ENCOUNTER — Other Ambulatory Visit: Payer: Self-pay

## 2023-08-14 ENCOUNTER — Other Ambulatory Visit

## 2023-08-14 MED ORDER — SIMVASTATIN 40 MG PO TABS: ORAL_TABLET | ORAL | 3 refills | Status: AC

## 2023-08-14 MED ORDER — MONTELUKAST SODIUM 10 MG PO TABS: ORAL_TABLET | ORAL | 3 refills | Status: AC

## 2023-08-18 NOTE — Telephone Encounter (Signed)
 Copied from CRM [id number]. Topic: Clinical - Request for Lab/Test Order >> Aug 18, 2023  9:38 AM Alyse July wrote: Reason for CRM: Patient missed the deadline for CT Scan of his face and needs a new order placed and faxed to [facility] per his insurance [employer] to generate a new Authorization number for Scan for appointment scheduled for May 6th. Please Expedited if possible per  [employer].

## 2023-08-25 ENCOUNTER — Other Ambulatory Visit (HOSPITAL_COMMUNITY): Payer: Self-pay

## 2023-08-25 NOTE — Telephone Encounter (Signed)
 Copied from CRM [id number]. Topic: Clinical - Request for Lab/Test Order >> Aug 25, 2023  9:37 AM Lizabeth Riggs wrote: Terril is calling back to see when the prior authorization with [employer]. He called his insurance and they do not have a request from doctor's office. His appointment is May 6 at [facility] and they need prior authorization before the appointment. Send Mauro a message when this is complete through MyChart. Thanks

## 2023-08-26 ENCOUNTER — Other Ambulatory Visit (INDEPENDENT_AMBULATORY_CARE_PROVIDER_SITE_OTHER)

## 2023-08-26 ENCOUNTER — Encounter: Payer: Self-pay | Admitting: Internal Medicine

## 2023-08-26 DIAGNOSIS — Z125 Encounter for screening for malignant neoplasm of prostate: Secondary | ICD-10-CM

## 2023-08-26 DIAGNOSIS — R739 Hyperglycemia, unspecified: Secondary | ICD-10-CM

## 2023-08-26 DIAGNOSIS — E538 Deficiency of other specified B group vitamins: Secondary | ICD-10-CM

## 2023-08-26 DIAGNOSIS — E559 Vitamin D deficiency, unspecified: Secondary | ICD-10-CM | POA: Diagnosis not present

## 2023-08-26 DIAGNOSIS — E7800 Pure hypercholesterolemia, unspecified: Secondary | ICD-10-CM

## 2023-08-26 LAB — BASIC METABOLIC PANEL WITH GFR
BUN: 25 mg/dL — ABNORMAL HIGH (ref 6–23)
CO2: 31 meq/L (ref 19–32)
Calcium: 9.7 mg/dL (ref 8.4–10.5)
Chloride: 104 meq/L (ref 96–112)
Creatinine, Ser: 1.15 mg/dL (ref 0.40–1.50)
GFR: 63.85 mL/min (ref >60.00)
Glucose, Bld: 93 mg/dL (ref 70–99)
Potassium: 5.3 meq/L — ABNORMAL HIGH (ref 3.5–5.1)
Sodium: 142 meq/L (ref 135–145)

## 2023-08-26 LAB — URINALYSIS, ROUTINE W REFLEX MICROSCOPIC
Bilirubin Urine: NEGATIVE
Hgb urine dipstick: NEGATIVE
Ketones, ur: NEGATIVE
Leukocytes,Ua: NEGATIVE
Nitrite: NEGATIVE
RBC / HPF: NONE SEEN (ref 0–?)
Specific Gravity, Urine: 1.025 (ref 1.000–1.030)
Total Protein, Urine: NEGATIVE
Urine Glucose: NEGATIVE
Urobilinogen, UA: 0.2 (ref 0.0–1.0)
WBC, UA: NONE SEEN (ref 0–?)
pH: 6 (ref 5.0–8.0)

## 2023-08-26 LAB — CBC WITH DIFFERENTIAL/PLATELET
Basophils Absolute: 0 10*3/uL (ref 0.0–0.1)
Basophils Relative: 0.4 % (ref 0.0–3.0)
Eosinophils Absolute: 0.1 10*3/uL (ref 0.0–0.7)
Eosinophils Relative: 1.4 % (ref 0.0–5.0)
HCT: 47.3 % (ref 39.0–52.0)
Hemoglobin: 16 g/dL (ref 13.0–17.0)
Lymphocytes Relative: 29.5 % (ref 12.0–46.0)
Lymphs Abs: 1.7 10*3/uL (ref 0.7–4.0)
MCHC: 33.9 g/dL (ref 30.0–36.0)
MCV: 96.7 fl (ref 78.0–100.0)
Monocytes Absolute: 0.4 10*3/uL (ref 0.1–1.0)
Monocytes Relative: 6.5 % (ref 3.0–12.0)
Neutro Abs: 3.7 10*3/uL (ref 1.4–7.7)
Neutrophils Relative %: 62.2 % (ref 43.0–77.0)
Platelets: 184 10*3/uL (ref 150.0–400.0)
RBC: 4.89 Mil/uL (ref 4.22–5.81)
RDW: 13.2 % (ref 11.5–15.5)
WBC: 5.9 10*3/uL (ref 4.0–10.5)

## 2023-08-26 LAB — TSH: TSH: 1.66 u[IU]/mL (ref 0.35–5.50)

## 2023-08-26 LAB — HEPATIC FUNCTION PANEL
ALT: 19 U/L (ref 0–53)
AST: 22 U/L (ref 0–37)
Albumin: 4.6 g/dL (ref 3.5–5.2)
Alkaline Phosphatase: 60 U/L (ref 39–117)
Bilirubin, Direct: 0.2 mg/dL (ref 0.0–0.3)
Total Bilirubin: 1 mg/dL (ref 0.2–1.2)
Total Protein: 7.1 g/dL (ref 6.0–8.3)

## 2023-08-26 LAB — VITAMIN B12: Vitamin B-12: 431 pg/mL (ref 211–911)

## 2023-08-26 LAB — VITAMIN D 25 HYDROXY (VIT D DEFICIENCY, FRACTURES): VITD: 36.14 ng/mL (ref 30.00–100.00)

## 2023-08-26 LAB — LIPID PANEL
Cholesterol: 124 mg/dL (ref 0–200)
HDL: 36.4 mg/dL — ABNORMAL LOW (ref >39.00)
LDL Cholesterol: 63 mg/dL (ref 0–99)
NonHDL: 87.53
Total CHOL/HDL Ratio: 3
Triglycerides: 122 mg/dL (ref 0.0–149.0)
VLDL: 24.4 mg/dL (ref 0.0–40.0)

## 2023-08-26 LAB — HEMOGLOBIN A1C: Hgb A1c MFr Bld: 5.8 % (ref 4.6–6.5)

## 2023-08-26 LAB — MICROALBUMIN / CREATININE URINE RATIO
Creatinine,U: 191.5 mg/dL
Microalb Creat Ratio: 3.8 mg/g (ref 0.0–30.0)
Microalb, Ur: 0.7 mg/dL (ref 0.0–1.9)

## 2023-08-26 LAB — PSA: PSA: 3.55 ng/mL (ref 0.10–4.00)

## 2023-08-27 ENCOUNTER — Encounter: Payer: Medicare HMO | Admitting: Internal Medicine

## 2023-08-27 NOTE — Telephone Encounter (Signed)
 Ok to forward to [facility] thanks

## 2023-08-28 ENCOUNTER — Telehealth: Payer: Self-pay

## 2023-08-28 NOTE — Telephone Encounter (Signed)
 Copied from CRM [id number]. Topic: Clinical - Request for Lab/Test Order >> Aug 18, 2023  9:38 AM Alyse July wrote: Reason for CRM: Patient missed the deadline for CT Scan of his face and needs a new order placed and faxed to [facility] per his insurance [employer] to generate a new Authorization number for Scan for appointment scheduled for May 6th. Please Expedited if possible per  [employer]. >> Aug 28, 2023  2:07 PM Howard Macho wrote: Danny Young from [facility] called stating the patient is trying to reach out to the doctor in regards to a CT scan authorization. Patient missed his appointment on 4/17 so it was rescheduled for 5/6 and now he need a new authorization for that date. Danny Young stated the doctor can reach [facility] for authorization  CB [phone] from 8am to 5pm >> Aug 25, 2023  9:37 AM Lizabeth Riggs wrote: Danny Young is calling back to see when the prior authorization with [employer]. He called his insurance and they do not have a request from doctor's office. His appointment is May 6 at [facility] and they need prior authorization before the appointment. Send Danny Young a message when this is complete through MyChart. Thanks

## 2023-08-29 ENCOUNTER — Encounter: Payer: Self-pay | Admitting: Internal Medicine

## 2023-09-02 ENCOUNTER — Ambulatory Visit
Admission: RE | Admit: 2023-09-02 | Discharge: 2023-09-02 | Disposition: A | Discharge status: Home / Self Care | Source: Ambulatory Visit | Attending: Internal Medicine | Admitting: Internal Medicine

## 2023-09-02 DIAGNOSIS — J329 Chronic sinusitis, unspecified: Secondary | ICD-10-CM

## 2023-09-02 DIAGNOSIS — J342 Deviated nasal septum: Secondary | ICD-10-CM | POA: Diagnosis not present

## 2023-09-02 DIAGNOSIS — J3489 Other specified disorders of nose and nasal sinuses: Secondary | ICD-10-CM | POA: Diagnosis not present

## 2023-09-02 DIAGNOSIS — R519 Headache, unspecified: Secondary | ICD-10-CM | POA: Diagnosis not present

## 2023-09-03 ENCOUNTER — Encounter: Payer: Self-pay | Admitting: Internal Medicine

## 2023-09-03 ENCOUNTER — Ambulatory Visit: Admitting: Internal Medicine

## 2023-09-03 VITALS — BP 126/78 | HR 55 | Temp 98.2°F | Ht 73.0 in | Wt 171.0 lb

## 2023-09-03 DIAGNOSIS — R739 Hyperglycemia, unspecified: Secondary | ICD-10-CM

## 2023-09-03 DIAGNOSIS — E559 Vitamin D deficiency, unspecified: Secondary | ICD-10-CM | POA: Diagnosis not present

## 2023-09-03 DIAGNOSIS — K429 Umbilical hernia without obstruction or gangrene: Secondary | ICD-10-CM | POA: Diagnosis not present

## 2023-09-03 DIAGNOSIS — R972 Elevated prostate specific antigen [PSA]: Secondary | ICD-10-CM | POA: Diagnosis not present

## 2023-09-03 DIAGNOSIS — E538 Deficiency of other specified B group vitamins: Secondary | ICD-10-CM | POA: Diagnosis not present

## 2023-09-03 DIAGNOSIS — Z0001 Encounter for general adult medical examination with abnormal findings: Secondary | ICD-10-CM | POA: Diagnosis not present

## 2023-09-03 DIAGNOSIS — E7800 Pure hypercholesterolemia, unspecified: Secondary | ICD-10-CM

## 2023-09-03 NOTE — Patient Instructions (Addendum)
 Please take OTC Vitamin D3 at 2000 units per day, indefinitely, and Mutlivitamin with B12  Please continue all other medications as before, and refills have been done if requested.  Please have the pharmacy call with any other refills you may need.  Please continue your efforts at being more active, low cholesterol diet, and weight control.  You are otherwise up to date with prevention measures today.  Please keep your appointments with your specialists as you may have planned  You will be contacted regarding the referral for: General Surgury  Please remember to come to the LAB in 6 mo for repeat PSA  Please make an Appointment to return for your 1 year visit, or sooner if needed, with Lab testing by Appointment as well, to be done about 3-5 days before at the FIRST FLOOR Lab (so this is for TWO appointments - please see the scheduling desk as you leave)

## 2023-09-03 NOTE — Progress Notes (Unsigned)
 Patient ID: Danny Young, male   DOB: 1950/12/26, 73 y.o.   MRN: [id number]         Chief Complaint:: wellness exam and low vit d and b12, increased PSA velocity, painful umbiilcal hernia       HPI:  Danny Young is a 73 y.o. male here for wellness exam; up to date                        Also Denies urinary symptoms such as dysuria, frequency, urgency, flank pain, hematuria or n/v, fever, chills.  Pt denies chest pain, increased sob or doe, wheezing, orthopnea, PND, increased LE swelling, palpitations, dizziness or syncope.   Pt denies polydipsia, polyuria, or new focal neuro s/s.    Pt denies fever, wt loss, night sweats, loss of appetite, or other constitutional symptoms  Does has recent onset 1 mo recurring pain mild intermittent umbilical hernia pain.  Danny Young worsening reflux, abd pain, dysphagia, n/v, bowel change or blood.    Wt Readings from Last 3 Encounters:  09/03/23 171 lb (77.6 kg)  07/22/23 171 lb (77.6 kg)  04/29/23 178 lb (80.7 kg)   BP Readings from Last 3 Encounters:  09/03/23 126/78  07/22/23 132/70  05/11/23 129/67   Immunization History  Administered Date(s) Administered   Fluad Quad(high Dose 65+) 01/11/2019, 01/21/2022   Influenza Split 01/20/2017   Influenza Whole 06/06/2009, 02/07/2010   Influenza, High Dose Seasonal PF 12/26/2016, 02/10/2018, 03/01/2021, 01/10/2023   Influenza,inj,Quad PF,6+ Mos 01/06/2013, 05/25/2014   Moderna Covid-19 Vaccine Bivalent Booster 51yrs & up 02/27/2022   Moderna Sars-Covid-2 Vaccination 05/25/2019, 06/22/2019, 03/04/2020   PNEUMOCOCCAL CONJUGATE-20 09/25/2022   Pfizer Covid-19 Vaccine Bivalent Booster 7yrs & up 02/12/2021, 12/18/2021   Pneumococcal Conjugate-13 04/08/2018   Pneumococcal Polysaccharide-23 08/24/2019   Rsv, Bivalent, Protein Subunit Rsvpref,pf Pattricia Bores) 01/10/2023   Td 06/06/2009   Tdap 08/24/2019   Zoster Recombinant(Shingrix) 10/19/2021, 12/25/2021   Zoster, Live 10/22/2011   Health Maintenance  Due  Topic Date Due   Medicare Annual Wellness (AWV)  Never done      Past Medical History:  Diagnosis Date   ALLERGIC RHINITIS 01/19/2007   Qualifier: Diagnosis of  By: Autry Legions MD, Alveda Aures    Allergy    seasonal   Cataract    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 He has never used smokeless tobacco. He reports current alcohol use. He reports that he does not use drugs. family history includes Hypertension in his father and mother. No Known Allergies Current Outpatient Medications on File Prior to Visit  Medication Sig Dispense Refill   albuterol  (VENTOLIN  HFA) 108 (90 Base) MCG/ACT inhaler INHALE 2 PUFFS BY MOUTH EVERY 6 HOURS AS NEEDED FOR WHEEZE OR SHORTNESS OF BREATH     aspirin  81 MG chewable tablet Chew 81 mg by mouth daily.      azithromycin  (ZITHROMAX )  250 MG tablet TAKE 2 TABLETS BY MOUTH TODAY, THEN TAKE 1 TABLET DAILY FOR 4 DAYS AS DIRECTED     ibuprofen (ADVIL) 200 MG tablet Take 600 mg by mouth every 6 (six) hours as needed for fever or headache.     levofloxacin  (LEVAQUIN ) 500 MG tablet Take 1 tablet (500 mg total) by mouth daily. 10 tablet 0   montelukast  (SINGULAIR ) 10 MG tablet TAKE 1 TABLET BY MOUTH EVERYDAY AT BEDTIME 90 tablet 3   Multiple Vitamin (MULTIVITAMIN) capsule Take 1 capsule by mouth daily.     simvastatin  (ZOCOR ) 40 MG tablet TAKE 1 TABLET BY MOUTH EVERYDAY AT BEDTIME 90 tablet 3   No current facility-administered medications on file prior to visit.        ROS:  All others  reviewed and negative.  Objective        PE:  BP 126/78 (BP Location: Right Arm, Patient Position: Sitting, Cuff Size: Normal)   Pulse (!) 55   Temp 98.2 F (36.8 C) (Oral)   Ht 6\' 1"  (1.854 m)   Wt 171 lb (77.6 kg)   SpO2 99%   BMI 22.56 kg/m                 Constitutional: Pt appears in NAD               HENT: Head: NCAT.                Right Ear: External ear normal.                 Left Ear: External ear normal.                Eyes: . Pupils are equal, round, and reactive to light. Conjunctivae and EOM are normal               Nose: without d/c or deformity               Neck: Neck supple. Gross normal ROM               Cardiovascular: Normal rate and regular rhythm.                 Pulmonary/Chest: Effort normal and breath sounds without rales or wheezing.                Abd:  Soft, NT, ND, + BS, no organomegaly               Neurological: Pt is alert. At baseline orientation, motor grossly intact               Skin: Skin is warm. No rashes, no other new lesions, LE edema - none               Psychiatric: Pt behavior is normal without agitation   Micro: none  Cardiac tracings I have personally interpreted today:  none  Pertinent Radiological findings (summarize): none   Lab Results  Component Value Date   WBC 5.9 08/26/2023   HGB 16.0 08/26/2023   HCT 47.3 08/26/2023   PLT 184.0 08/26/2023   GLUCOSE 93 08/26/2023   CHOL 124 08/26/2023   TRIG 122.0 08/26/2023   HDL 36.40 (L) 08/26/2023   LDLDIRECT 84.1 01/06/2007   LDLCALC 63 08/26/2023   ALT 19 08/26/2023   AST 22 08/26/2023   NA 142 08/26/2023   K 5.3 No hemolysis seen (H) 08/26/2023   CL 104 08/26/2023  CREATININE 1.15 08/26/2023   BUN 25 (H) 08/26/2023   CO2 31 08/26/2023   TSH 1.66 08/26/2023   PSA 3.55 08/26/2023   INR 1.0 ratio 07/13/2009   HGBA1C 5.8 08/26/2023   MICROALBUR 0.7 08/26/2023   Assessment/Plan:  Danny Young is a 73 y.o. White or Caucasian [1] male with  has a past medical  history of ALLERGIC RHINITIS (01/19/2007), Allergy, Cataract, Cervical spine degeneration (10/22/2011), GERD (01/19/2007), HYPERLIPIDEMIA (01/19/2007), MIGRAINE HEADACHE (01/19/2007), Sleep apnea, and WOLFF (WOLFE)-PARKINSON-WHITE (WPW) SYNDROME (01/19/2007).  Encounter for well adult exam with abnormal findings Age and sex appropriate education and counseling updated with regular exercise and diet Referrals for preventative services - none needed Immunizations addressed - none needed Smoking counseling  - none needed Evidence for depression or other mood disorder - none significant Most recent labs reviewed. I have personally reviewed and have noted: 1) the patient's medical and social history 2) The patient's current medications and supplements 3) The patient's height, weight, and BMI have been recorded in the chart   Hyperlipidemia Lab Results  Component Value Date   LDLCALC 63 08/26/2023   Stable, pt to continue current statin zocor  40 mg qd   Umbilical hernia With increased pain mild intermittent, for general surgury referral  Vitamin D  deficiency Last vitamin D  Lab Results  Component Value Date   VD25OH 36.14 08/26/2023   Low, to start oral replacement   B12 deficiency Lab Results  Component Value Date   VITAMINB12 431 08/26/2023   Stable, cont oral replacement - b12 1000 mcg qd   Increased prostate specific antigen (PSA) velocity Lab Results  Component Value Date   PSA 3.55 08/26/2023   PSA 2.68 08/26/2022   PSA 2.47 08/23/2021   Has increased velocity, will need PSA in 6 month repeat  Followup: Return in about 1 year (around 09/02/2024).  Rosalia Colonel, MD 09/04/2023 7:16 PM [facility]

## 2023-09-04 ENCOUNTER — Encounter: Payer: Self-pay | Admitting: Internal Medicine

## 2023-09-04 DIAGNOSIS — R972 Elevated prostate specific antigen [PSA]: Secondary | ICD-10-CM | POA: Insufficient documentation

## 2023-09-04 DIAGNOSIS — E538 Deficiency of other specified B group vitamins: Secondary | ICD-10-CM | POA: Insufficient documentation

## 2023-09-04 DIAGNOSIS — E559 Vitamin D deficiency, unspecified: Secondary | ICD-10-CM | POA: Insufficient documentation

## 2023-09-04 NOTE — Assessment & Plan Note (Signed)
 Lab Results  Component Value Date   LDLCALC 63 08/26/2023   Stable, pt to continue current statin zocor  40 mg qd

## 2023-09-04 NOTE — Assessment & Plan Note (Signed)

## 2023-09-04 NOTE — Assessment & Plan Note (Signed)
 Last vitamin D  Lab Results  Component Value Date   VD25OH 36.14 08/26/2023   Low, to start oral replacement

## 2023-09-04 NOTE — Assessment & Plan Note (Signed)
 Lab Results  Component Value Date   PSA 3.55 08/26/2023   PSA 2.68 08/26/2022   PSA 2.47 08/23/2021   Has increased velocity, will need PSA in 6 month repeat

## 2023-09-04 NOTE — Assessment & Plan Note (Signed)
 With increased pain mild intermittent, for general surgury referral

## 2023-09-04 NOTE — Assessment & Plan Note (Signed)
 Lab Results  Component Value Date   VITAMINB12 431 08/26/2023   Stable, cont oral replacement - b12 1000 mcg qd

## 2023-09-10 DIAGNOSIS — M7062 Trochanteric bursitis, left hip: Secondary | ICD-10-CM | POA: Diagnosis not present

## 2023-09-15 ENCOUNTER — Ambulatory Visit

## 2023-09-15 VITALS — BP 132/78 | HR 68 | Ht 73.0 in | Wt 170.6 lb

## 2023-09-15 DIAGNOSIS — Z0000 Encounter for general adult medical examination without abnormal findings: Secondary | ICD-10-CM

## 2023-09-15 NOTE — Patient Instructions (Addendum)
 [name] , Thank you for taking time out of your busy schedule to complete your Annual Wellness Visit with me. I enjoyed our conversation and look forward to speaking with you again next year. I, as well as your care team,  appreciate your ongoing commitment to your health goals. Please review the following plan we discussed and let me know if I can assist you in the future. Your Game plan/ To Do List   Follow up Visits: Next Medicare AWV with our clinical staff: 09/17/2024   Have you seen your provider in the last 6 months (3 months if uncontrolled diabetes)? No Next Office Visit with your provider: 09/17/2024 - Physical  Clinician Recommendations:  Aim for 30 minutes of exercise or brisk walking, 6-8 glasses of water, and 5 servings of fruits and vegetables each day.       This is a list of the screening recommended for you and due dates:  Health Maintenance  Topic Date Due   Flu Shot  11/28/2023   Medicare Annual Wellness Visit  09/14/2024   Colon Cancer Screening  10/07/2028   DTaP/Tdap/Td vaccine (3 - Td or Tdap) 08/23/2029   Pneumonia Vaccine  Completed   Hepatitis C Screening  Completed   Zoster (Shingles) Vaccine  Completed   HPV Vaccine  Aged Out   Meningitis B Vaccine  Aged Out   COVID-19 Vaccine  Discontinued    Advanced directives: (Copy Requested) Please bring a copy of your health care power of attorney and living will to the office to be added to your chart at your convenience. You can mail to [facility] [address] [place], [place] [address] or email to [email or web address] Advance Care Planning is important because it:  [x]  Makes sure you receive the medical care that is consistent with your values, goals, and preferences  [x]  It provides guidance to your family and loved ones and reduces their decisional burden about whether or not they are making the right decisions based on your wishes.  Follow the link provided in your after visit  summary or read over the paperwork we have mailed to you to help you started getting your Advance Directives in place. If you need assistance in completing these, please reach out to us  so that we can help you!

## 2023-09-15 NOTE — Progress Notes (Signed)
 Subjective:   Danny Young is a 73 y.o. who presents for a Medicare Wellness preventive visit.  As a reminder, Annual Wellness Visits don't include a physical exam, and some assessments may be limited, especially if this visit is performed virtually. We may recommend an in-person follow-up visit with your provider if needed.  Visit Complete: In person  VideoDeclined- This patient declined Interactive audio and Acupuncturist. Therefore the visit was completed with audio only.  Persons Participating in Visit: Patient.  AWV Questionnaire: Yes: Patient Medicare AWV questionnaire was completed by the patient on 09/12/2023; I have confirmed that all information answered by patient is correct and no changes since this date.  Cardiac Risk Factors include: advanced age (>97men, >38 women);dyslipidemia;male gender     Objective:     Today's Vitals   09/15/23 1424  BP: 132/78  Pulse: 68  SpO2: 97%  Weight: 170 lb 9.6 oz (77.4 kg)  Height: 6\' 1"  (1.854 m)   Body mass index is 22.51 kg/m.     09/15/2023    2:23 PM 05/11/2023    1:30 PM  Advanced Directives  Does Patient Have a Medical Advance Directive? Yes No  Type of Estate agent of [place];Living will   Copy of Healthcare Power of Attorney in Chart? No - copy requested     Current Medications (verified) Outpatient Encounter Medications as of 09/15/2023  Medication Sig   albuterol  (VENTOLIN  HFA) 108 (90 Base) MCG/ACT inhaler INHALE 2 PUFFS BY MOUTH EVERY 6 HOURS AS NEEDED FOR WHEEZE OR SHORTNESS OF BREATH   aspirin  81 MG chewable tablet Chew 81 mg by mouth daily.    cyanocobalamin  (VITAMIN B12) 1000 MCG tablet Take 1,000 mcg by mouth daily. Takes 2 tabs daily   ibuprofen (ADVIL) 200 MG tablet Take 600 mg by mouth every 6 (six) hours as needed for fever or headache.   montelukast  (SINGULAIR ) 10 MG tablet TAKE 1 TABLET BY MOUTH EVERYDAY AT BEDTIME   Multiple Vitamin (MULTIVITAMIN) capsule Take  1 capsule by mouth daily.   simvastatin  (ZOCOR ) 40 MG tablet TAKE 1 TABLET BY MOUTH EVERYDAY AT BEDTIME   [DISCONTINUED] levofloxacin  (LEVAQUIN ) 500 MG tablet Take 1 tablet (500 mg total) by mouth daily.   No facility-administered encounter medications on file as of 09/15/2023.    Allergies (verified) Patient has no known allergies.   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 , Dr Klein/card    Past Surgical History:  Procedure Laterality Date   COLONOSCOPY     rotater cuff     left   Family History  Problem Relation Age of Onset   Hypertension Mother    Hypertension Father    Social History   Socioeconomic History   Marital status: Married    Spouse name: Not on file   Number of children: Not on file   Years of education: Not on  file   Highest education level: 12th grade  Occupational History   Not on file  Tobacco Use   Smoking status: Never    Passive exposure: Never   Smokeless tobacco: Never  Vaping Use   Vaping status: Never Used  Substance and Sexual Activity   Alcohol use: Yes    Comment: very occasional wine   Drug use: No   Sexual activity: Yes  Other Topics Concern   Not on file  Social History Narrative   Married   Social Drivers of Health   Financial Resource Strain: Low Risk  (09/15/2023)   Overall Financial Resource Strain (CARDIA)    Difficulty of Paying Living Expenses: Not hard at all  Food Insecurity: No Food Insecurity (09/15/2023)   Hunger Vital Sign     Worried About Running Out of Food in the Last Year: Never true    Ran Out of Food in the Last Year: Never true  Transportation Needs: No Transportation Needs (09/15/2023)   PRAPARE - Administrator, Civil Service (Medical): No    Lack of Transportation (Non-Medical): No  Physical Activity: Sufficiently Active (09/15/2023)   Exercise Vital Sign    Days of Exercise per Week: 7 days    Minutes of Exercise per Session: 30 min  Stress: No Stress Concern Present (09/15/2023)   [employer] of Occupational Health - Occupational Stress Questionnaire    Feeling of Stress : Not at all  Social Connections: Moderately Isolated (09/15/2023)   Social Connection and Isolation Panel [NHANES]    Frequency of Communication with Friends and Family: Twice a week    Frequency of Social Gatherings with Friends and Family: More than three times a week    Attends Religious Services: Never    Database administrator or Organizations: No    Attends Engineer, structural: Never    Marital Status: Married    Tobacco Counseling Counseling given: No    Clinical Intake:  Pre-visit preparation completed: Yes  Pain : No/denies pain     BMI - recorded: 22.51 Nutritional Risks: None Diabetes: No  Lab Results  Component Value Date   HGBA1C 5.8 08/26/2023   HGBA1C 5.7 08/26/2022   HGBA1C 5.5 08/23/2021     How often do you need to have someone help you when you read instructions, pamphlets, or other written materials from your doctor or pharmacy?: 1 - Never  Interpreter Needed?: No  Information entered by :: Kandy Orris, CMA   Activities of Daily Living     09/15/2023    2:28 PM 09/12/2023   10:25 AM  In your present state of health, do you have any difficulty performing the following activities:  Hearing? 0 0  Vision? 0 0  Difficulty concentrating or making decisions? 0 0  Walking or climbing stairs? 0 0  Dressing or bathing? 0 0  Doing errands, shopping? 0 0   Preparing Food and eating ? N N  Using the Toilet? N N  In the past six months, have you accidently leaked urine? N N  Do you have problems with loss of bowel control? N N  Managing your Medications? N N  Managing your Finances? N N  Housekeeping or managing your Housekeeping? N N    Patient Care Team: Roslyn Coombe, MD as PCP - Leeroy Pulley, [place] as Referring Physician (Optometry) Drusilla Gerlach, MD as Consulting Physician (Dermatology)  Indicate any recent Medical Services you may have received from other than [facility]  providers in the past year (date may be approximate).     Assessment:    This is a routine wellness examination for Login.  Hearing/Vision screen Hearing Screening - Comments:: Denies hearing difficulties   Vision Screening - Comments:: Wears rx glasses - up to date with routine eye exams with Dr Joanne Muckle   Goals Addressed               This Visit's Progress     Patient Stated (pt-stated)        Patient stated he wants to live until 63yrs -keep living.       Depression Screen     09/15/2023    2:31 PM 09/03/2023    9:04 AM 07/22/2023    3:03 PM 04/29/2023    8:11 AM 08/26/2022    9:07 AM 04/01/2022    9:40 AM 02/01/2022    1:27 PM  PHQ 2/9 Scores  PHQ - 2 Score 0 0 0 0 0 0   PHQ- 9 Score 0     0   Exception Documentation       Patient refusal    Fall Risk     09/15/2023    2:29 PM 09/12/2023   10:25 AM 09/03/2023    9:09 AM 07/22/2023    3:09 PM 04/29/2023    8:11 AM  Fall Risk   Falls in the past year? 0 0 0 0 0  Number falls in past yr: 0  0 0 0  Injury with Fall? 0  0 0 0  Risk for fall due to : No Fall Risks  No Fall Risks No Fall Risks No Fall Risks  Follow up Falls prevention discussed;Falls evaluation completed  Falls evaluation completed Falls evaluation completed Falls evaluation completed    MEDICARE RISK AT HOME:  Medicare Risk at Home Any stairs in or around the home?: Yes If so, are there any without handrails?: No Home  free of loose throw rugs in walkways, pet beds, electrical cords, etc?: Yes Adequate lighting in your home to reduce risk of falls?: Yes Life alert?: No Use of a cane, walker or w/c?: No Grab bars in the bathroom?: No Shower chair or bench in shower?: Yes Elevated toilet seat or a handicapped toilet?: No  TIMED UP AND GO:  Was the test performed?  No  Cognitive Function: 6CIT completed        09/15/2023    2:35 PM  6CIT Screen  What Year? 0 points  What month? 0 points  What time? 0 points  Count back from 20 0 points  Months in reverse 0 points  Repeat phrase 0 points  Total Score 0 points    Immunizations Immunization History  Administered Date(s) Administered   Fluad Quad(high Dose 65+) 01/11/2019, 01/21/2022   Influenza Split 01/20/2017   Influenza Whole 06/06/2009, 02/07/2010   Influenza, High Dose Seasonal PF 12/26/2016, 02/10/2018, 03/01/2021, 01/10/2023   Influenza,inj,Quad PF,6+ Mos 01/06/2013, 05/25/2014   Moderna Covid-19 Vaccine Bivalent Booster 47yrs & up 02/27/2022   Moderna Sars-Covid-2 Vaccination 05/25/2019, 06/22/2019, 03/04/2020   PNEUMOCOCCAL CONJUGATE-20 09/25/2022   Pfizer Covid-19 Vaccine Bivalent Booster 63yrs & up 02/12/2021, 12/18/2021   Pneumococcal Conjugate-13 04/08/2018   Pneumococcal Polysaccharide-23 08/24/2019   Rsv, Bivalent, Protein Subunit Rsvpref,pf Pattricia Bores) 01/10/2023   Td 06/06/2009   Tdap 08/24/2019   Zoster Recombinant(Shingrix) 10/19/2021, 12/25/2021   Zoster, Live 10/22/2011    Screening Tests Health Maintenance  Topic Date Due   INFLUENZA VACCINE  11/28/2023  Medicare Annual Wellness (AWV)  09/14/2024   Colonoscopy  10/07/2028   DTaP/Tdap/Td (3 - Td or Tdap) 08/23/2029   Pneumonia Vaccine 44+ Years old  Completed   Hepatitis C Screening  Completed   Zoster Vaccines- Shingrix  Completed   HPV VACCINES  Aged Out   Meningococcal B Vaccine  Aged Out   COVID-19 Vaccine  Discontinued    Health Maintenance  There  are no preventive care reminders to display for this patient. Health Maintenance Items Addressed: 09/15/2023  Additional Screening:  Vision Screening: Recommended annual ophthalmology exams for early detection of glaucoma and other disorders of the eye.  Dental Screening: Recommended annual dental exams for proper oral hygiene  Community Resource Referral / Chronic Care Management: CRR required this visit?  No   CCM required this visit?  No   Plan:    I have personally reviewed and noted the following in the patient's chart:   Medical and social history Use of alcohol, tobacco or illicit drugs  Current medications and supplements including opioid prescriptions. Patient is not currently taking opioid prescriptions. Functional ability and status Nutritional status Physical activity Advanced directives List of other physicians Hospitalizations, surgeries, and ER visits in previous 12 months Vitals Screenings to include cognitive, depression, and falls Referrals and appointments  In addition, I have reviewed and discussed with patient certain preventive protocols, quality metrics, and best practice recommendations. A written personalized care plan for preventive services as well as general preventive health recommendations were provided to patient.   Patria Bookbinder, CMA   09/15/2023   After Visit Summary: (In Person-Declined) Patient declined AVS at this time.  Notes: Nothing significant to report at this time.

## 2023-09-24 ENCOUNTER — Telehealth: Payer: Self-pay | Admitting: Internal Medicine

## 2023-09-24 NOTE — Telephone Encounter (Signed)
 Very sorry, still dont have results, but hopefully will be coming soon, and we should let him know asap   thanks

## 2023-09-24 NOTE — Telephone Encounter (Signed)
 Copied from CRM [id number]. Topic: Clinical - Lab/Test Results >> Sep 24, 2023  9:03 AM Dewanda Foots wrote: Reason for CRM: CT Scan was done on 5/6 and he would like someone to call him and go over the results with him please.   Please call [phone] and you can leave VM if needed.

## 2023-09-29 DIAGNOSIS — K429 Umbilical hernia without obstruction or gangrene: Secondary | ICD-10-CM | POA: Diagnosis not present

## 2023-10-02 ENCOUNTER — Ambulatory Visit: Payer: Self-pay | Admitting: Internal Medicine

## 2023-10-02 NOTE — Telephone Encounter (Signed)
 Copied from CRM [id number]. Topic: Clinical - Lab/Test Results >> Oct 01, 2023  4:30 PM DeAngela L wrote: Reason for CRM: Patient would like a call from an office manager or Dr Autry Legions about his CT scan 09/02/23 and the patient has been patiently waiting and not so happy about not getting a call back Patient still would like a call [phone] (M)

## 2024-01-15 DIAGNOSIS — K080 Exfoliation of teeth due to systemic causes: Secondary | ICD-10-CM | POA: Diagnosis not present

## 2024-02-13 DIAGNOSIS — K429 Umbilical hernia without obstruction or gangrene: Secondary | ICD-10-CM | POA: Diagnosis not present

## 2024-03-04 ENCOUNTER — Ambulatory Visit: Payer: Self-pay | Admitting: Internal Medicine

## 2024-03-04 ENCOUNTER — Other Ambulatory Visit (INDEPENDENT_AMBULATORY_CARE_PROVIDER_SITE_OTHER)

## 2024-03-04 DIAGNOSIS — E7800 Pure hypercholesterolemia, unspecified: Secondary | ICD-10-CM

## 2024-03-04 DIAGNOSIS — E538 Deficiency of other specified B group vitamins: Secondary | ICD-10-CM

## 2024-03-04 DIAGNOSIS — E559 Vitamin D deficiency, unspecified: Secondary | ICD-10-CM | POA: Diagnosis not present

## 2024-03-04 DIAGNOSIS — R739 Hyperglycemia, unspecified: Secondary | ICD-10-CM | POA: Diagnosis not present

## 2024-03-04 DIAGNOSIS — R972 Elevated prostate specific antigen [PSA]: Secondary | ICD-10-CM

## 2024-03-04 LAB — BASIC METABOLIC PANEL WITH GFR
BUN: 27 mg/dL — ABNORMAL HIGH (ref 6–23)
CO2: 29 meq/L (ref 19–32)
Calcium: 9.2 mg/dL (ref 8.4–10.5)
Chloride: 106 meq/L (ref 96–112)
Creatinine, Ser: 1.09 mg/dL (ref 0.40–1.50)
GFR: 67.84 mL/min (ref >60.00)
Glucose, Bld: 98 mg/dL (ref 70–99)
Potassium: 4.7 meq/L (ref 3.5–5.1)
Sodium: 141 meq/L (ref 135–145)

## 2024-03-04 LAB — URINALYSIS, ROUTINE W REFLEX MICROSCOPIC
Bilirubin Urine: NEGATIVE
Hgb urine dipstick: NEGATIVE
Ketones, ur: NEGATIVE
Leukocytes,Ua: NEGATIVE
Nitrite: NEGATIVE
RBC / HPF: NONE SEEN (ref 0–?)
Specific Gravity, Urine: 1.02 (ref 1.000–1.030)
Total Protein, Urine: NEGATIVE
Urine Glucose: NEGATIVE
Urobilinogen, UA: 0.2 (ref 0.0–1.0)
WBC, UA: NONE SEEN (ref 0–?)
pH: 6 (ref 5.0–8.0)

## 2024-03-04 LAB — LIPID PANEL
Cholesterol: 139 mg/dL (ref 0–200)
HDL: 39.8 mg/dL (ref >39.00)
LDL Cholesterol: 81 mg/dL (ref 0–99)
NonHDL: 99.24
Total CHOL/HDL Ratio: 3
Triglycerides: 93 mg/dL (ref 0.0–149.0)
VLDL: 18.6 mg/dL (ref 0.0–40.0)

## 2024-03-04 LAB — CBC WITH DIFFERENTIAL/PLATELET
Basophils Absolute: 0 K/uL (ref 0.0–0.1)
Basophils Relative: 0.4 % (ref 0.0–3.0)
Eosinophils Absolute: 0.1 K/uL (ref 0.0–0.7)
Eosinophils Relative: 1.6 % (ref 0.0–5.0)
HCT: 44.3 % (ref 39.0–52.0)
Hemoglobin: 14.8 g/dL (ref 13.0–17.0)
Lymphocytes Relative: 36.5 % (ref 12.0–46.0)
Lymphs Abs: 1.8 K/uL (ref 0.7–4.0)
MCHC: 33.5 g/dL (ref 30.0–36.0)
MCV: 95 fl (ref 78.0–100.0)
Monocytes Absolute: 0.4 K/uL (ref 0.1–1.0)
Monocytes Relative: 7.7 % (ref 3.0–12.0)
Neutro Abs: 2.6 K/uL (ref 1.4–7.7)
Neutrophils Relative %: 53.8 % (ref 43.0–77.0)
Platelets: 169 K/uL (ref 150.0–400.0)
RBC: 4.66 Mil/uL (ref 4.22–5.81)
RDW: 12 % (ref 11.5–15.5)
WBC: 4.8 K/uL (ref 4.0–10.5)

## 2024-03-04 LAB — PSA: PSA: 2.56 ng/mL (ref 0.10–4.00)

## 2024-03-04 LAB — HEPATIC FUNCTION PANEL
ALT: 25 U/L (ref 0–53)
AST: 26 U/L (ref 0–37)
Albumin: 4.5 g/dL (ref 3.5–5.2)
Alkaline Phosphatase: 70 U/L (ref 39–117)
Bilirubin, Direct: 0.1 mg/dL (ref 0.0–0.3)
Total Bilirubin: 0.7 mg/dL (ref 0.2–1.2)
Total Protein: 6.8 g/dL (ref 6.0–8.3)

## 2024-03-04 LAB — TSH: TSH: 1.23 u[IU]/mL (ref 0.35–5.50)

## 2024-03-04 LAB — VITAMIN B12: Vitamin B-12: 998 pg/mL — ABNORMAL HIGH (ref 211–911)

## 2024-03-04 LAB — VITAMIN D 25 HYDROXY (VIT D DEFICIENCY, FRACTURES): VITD: 35.7 ng/mL (ref 30.00–100.00)

## 2024-03-04 LAB — HEMOGLOBIN A1C: Hgb A1c MFr Bld: 5.8 % (ref 4.6–6.5)

## 2024-03-04 NOTE — Progress Notes (Signed)
 The test results show that your current treatment is OK, as the tests are stable.  Please continue the same plan.  There is no other need for change of treatment or further evaluation based on these results, at this time.  thanks

## 2024-03-30 ENCOUNTER — Telehealth: Payer: Self-pay | Admitting: Internal Medicine

## 2024-03-30 DIAGNOSIS — E559 Vitamin D deficiency, unspecified: Secondary | ICD-10-CM

## 2024-03-30 NOTE — Telephone Encounter (Signed)
I will accept

## 2024-03-30 NOTE — Telephone Encounter (Signed)
 Patient would like to know if Dr. Geofm will accept him as Dr. Norleen is leaving. Call back [phone]

## 2024-03-31 ENCOUNTER — Encounter: Payer: Self-pay | Admitting: Internal Medicine

## 2024-03-31 NOTE — Progress Notes (Signed)
 Patient ID: Danny Young, male   DOB: [birth year], 73 y.o.   MRN: [id number]  error

## 2024-03-31 NOTE — Patient Instructions (Signed)
Error, pt not seen.

## 2024-04-06 DIAGNOSIS — K429 Umbilical hernia without obstruction or gangrene: Secondary | ICD-10-CM | POA: Diagnosis not present

## 2024-09-17 ENCOUNTER — Ambulatory Visit

## 2024-09-17 ENCOUNTER — Encounter: Admitting: Internal Medicine

## 2024-09-17 ENCOUNTER — Ambulatory Visit: Admitting: Internal Medicine
